# Patient Record
Sex: Male | Born: 1940
Health system: Southern US, Community
[De-identification: ages and names within clinical notes are randomized; demographics above are authoritative.]

## PROBLEM LIST (undated history)

## (undated) DIAGNOSIS — I10 Essential (primary) hypertension: Secondary | ICD-10-CM

## (undated) DIAGNOSIS — R109 Unspecified abdominal pain: Secondary | ICD-10-CM

## (undated) DIAGNOSIS — M1990 Unspecified osteoarthritis, unspecified site: Secondary | ICD-10-CM

## (undated) DIAGNOSIS — I4891 Unspecified atrial fibrillation: Secondary | ICD-10-CM

## (undated) DIAGNOSIS — E119 Type 2 diabetes mellitus without complications: Secondary | ICD-10-CM

## (undated) HISTORY — DX: Unspecified osteoarthritis, unspecified site: M19.90

---

## 2013-10-14 ENCOUNTER — Ambulatory Visit (INDEPENDENT_AMBULATORY_CARE_PROVIDER_SITE_OTHER): Payer: Medicare HMO

## 2013-10-14 ENCOUNTER — Ambulatory Visit (INDEPENDENT_AMBULATORY_CARE_PROVIDER_SITE_OTHER): Payer: Medicare HMO | Admitting: Family Medicine

## 2013-10-14 VITALS — BP 142/78 | HR 81 | Temp 97.7°F | Resp 16 | Ht 69.0 in | Wt 216.2 lb

## 2013-10-14 DIAGNOSIS — M25579 Pain in unspecified ankle and joints of unspecified foot: Secondary | ICD-10-CM

## 2013-10-14 DIAGNOSIS — M109 Gout, unspecified: Secondary | ICD-10-CM

## 2013-10-14 DIAGNOSIS — B351 Tinea unguium: Secondary | ICD-10-CM

## 2013-10-14 DIAGNOSIS — M25571 Pain in right ankle and joints of right foot: Secondary | ICD-10-CM

## 2013-10-14 DIAGNOSIS — M1990 Unspecified osteoarthritis, unspecified site: Secondary | ICD-10-CM

## 2013-10-14 DIAGNOSIS — E119 Type 2 diabetes mellitus without complications: Secondary | ICD-10-CM

## 2013-10-14 DIAGNOSIS — M129 Arthropathy, unspecified: Secondary | ICD-10-CM

## 2013-10-14 DIAGNOSIS — M10071 Idiopathic gout, right ankle and foot: Secondary | ICD-10-CM

## 2013-10-14 LAB — POCT CBC
GRANULOCYTE PERCENT: 81.1 % — AB (ref 37–80)
HCT, POC: 45.7 % (ref 43.5–53.7)
Hemoglobin: 14.5 g/dL (ref 14.1–18.1)
Lymph, poc: 1.1 (ref 0.6–3.4)
MCH, POC: 30.2 pg (ref 27–31.2)
MCHC: 31.8 g/dL (ref 31.8–35.4)
MCV: 95.1 fL (ref 80–97)
MID (CBC): 0.3 (ref 0–0.9)
MPV: 7 fL (ref 0–99.8)
PLATELET COUNT, POC: 244 10*3/uL (ref 142–424)
POC Granulocyte: 0.3 — AB (ref 2–6.9)
POC LYMPH PERCENT: 15.3 %L (ref 10–50)
POC MID %: 3.6 % (ref 0–12)
RBC: 4.81 M/uL (ref 4.69–6.13)
RDW, POC: 14.2 %
WBC: 7 10*3/uL (ref 4.6–10.2)

## 2013-10-14 LAB — POCT SEDIMENTATION RATE: POCT SED RATE: 16 mm/h (ref 0–22)

## 2013-10-14 LAB — POCT GLYCOSYLATED HEMOGLOBIN (HGB A1C): Hemoglobin A1C: 6.5

## 2013-10-14 LAB — URIC ACID: Uric Acid, Serum: 5 mg/dL (ref 4.0–7.8)

## 2013-10-14 LAB — GLUCOSE, POCT (MANUAL RESULT ENTRY): POC Glucose: 186 mg/dl — AB (ref 70–99)

## 2013-10-14 MED ORDER — COLCHICINE 0.6 MG PO TABS: ORAL_TABLET | ORAL | Status: DC

## 2013-10-14 MED ORDER — INDOMETHACIN 50 MG PO CAPS: 50.0000 mg | ORAL_CAPSULE | Freq: Three times a day (TID) | ORAL | Status: DC

## 2013-10-14 MED ORDER — TRAMADOL HCL 50 MG PO TABS: 50.0000 mg | ORAL_TABLET | Freq: Three times a day (TID) | ORAL | Status: DC | PRN

## 2013-10-14 NOTE — Patient Instructions (Addendum)
Start the indomethacin three times a day with food. Ice your foot for 20 minutes four times a day.  If the colcyrs is not to expensive, take these today but if they are expensive, it is ok to not take them.  If you are having any additional pain, you can try the tramadol in addition.  Whenever you can elevate your foot and keep it propped up.  If you are still having any pain in 1-2 weeks or if it worsens, come back to clinic immediately.  Gout Gout is an inflammatory arthritis caused by a buildup of uric acid crystals in the joints. Uric acid is a chemical that is normally present in the blood. When the level of uric acid in the blood is too high it can form crystals that deposit in your joints and tissues. This causes joint redness, soreness, and swelling (inflammation). Repeat attacks are common. Over time, uric acid crystals can form into masses (tophi) near a joint, destroying bone and causing disfigurement. Gout is treatable and often preventable. CAUSES  The disease begins with elevated levels of uric acid in the blood. Uric acid is produced by your body when it breaks down a naturally found substance called purines. Certain foods you eat, such as meats and fish, contain high amounts of purines. Causes of an elevated uric acid level include:  Being passed down from parent to child (heredity).  Diseases that cause increased uric acid production (such as obesity, psoriasis, and certain cancers).  Excessive alcohol use.  Diet, especially diets rich in meat and seafood.  Medicines, including certain cancer-fighting medicines (chemotherapy), water pills (diuretics), and aspirin.  Chronic kidney disease. The kidneys are no longer able to remove uric acid well.  Problems with metabolism. Conditions strongly associated with gout include:  Obesity.  High blood pressure.  High cholesterol.  Diabetes. Not everyone with elevated uric acid levels gets gout. It is not understood why some people  get gout and others do not. Surgery, joint injury, and eating too much of certain foods are some of the factors that can lead to gout attacks. SYMPTOMS   An attack of gout comes on quickly. It causes intense pain with redness, swelling, and warmth in a joint.  Fever can occur.  Often, only one joint is involved. Certain joints are more commonly involved:  Base of the big toe.  Knee.  Ankle.  Wrist.  Finger. Without treatment, an attack usually goes away in a few days to weeks. Between attacks, you usually will not have symptoms, which is different from many other forms of arthritis. DIAGNOSIS  Your caregiver will suspect gout based on your symptoms and exam. In some cases, tests may be recommended. The tests may include:  Blood tests.  Urine tests.  X-rays.  Joint fluid exam. This exam requires a needle to remove fluid from the joint (arthrocentesis). Using a microscope, gout is confirmed when uric acid crystals are seen in the joint fluid. TREATMENT  There are two phases to gout treatment: treating the sudden onset (acute) attack and preventing attacks (prophylaxis).  Treatment of an Acute Attack.  Medicines are used. These include anti-inflammatory medicines or steroid medicines.  An injection of steroid medicine into the affected joint is sometimes necessary.  The painful joint is rested. Movement can worsen the arthritis.  You may use warm or cold treatments on painful joints, depending which works best for you.  Treatment to Prevent Attacks.  If you suffer from frequent gout attacks, your caregiver may  advise preventive medicine. These medicines are started after the acute attack subsides. These medicines either help your kidneys eliminate uric acid from your body or decrease your uric acid production. You may need to stay on these medicines for a very long time.  The early phase of treatment with preventive medicine can be associated with an increase in acute gout  attacks. For this reason, during the first few months of treatment, your caregiver may also advise you to take medicines usually used for acute gout treatment. Be sure you understand your caregiver's directions. Your caregiver may make several adjustments to your medicine dose before these medicines are effective.  Discuss dietary treatment with your caregiver or dietitian. Alcohol and drinks high in sugar and fructose and foods such as meat, poultry, and seafood can increase uric acid levels. Your caregiver or dietitian can advise you on drinks and foods that should be limited. HOME CARE INSTRUCTIONS   Do not take aspirin to relieve pain. This raises uric acid levels.  Only take over-the-counter or prescription medicines for pain, discomfort, or fever as directed by your caregiver.  Rest the joint as much as possible. When in bed, keep sheets and blankets off painful areas.  Keep the affected joint raised (elevated).  Apply warm or cold treatments to painful joints. Use of warm or cold treatments depends on which works best for you.  Use crutches if the painful joint is in your leg.  Drink enough fluids to keep your urine clear or pale yellow. This helps your body get rid of uric acid. Limit alcohol, sugary drinks, and fructose drinks.  Follow your dietary instructions. Pay careful attention to the amount of protein you eat. Your daily diet should emphasize fruits, vegetables, whole grains, and fat-free or low-fat milk products. Discuss the use of coffee, vitamin C, and cherries with your caregiver or dietitian. These may be helpful in lowering uric acid levels.  Maintain a healthy body weight. SEEK MEDICAL CARE IF:   You develop diarrhea, vomiting, or any side effects from medicines.  You do not feel better in 24 hours, or you are getting worse. SEEK IMMEDIATE MEDICAL CARE IF:   Your joint becomes suddenly more tender, and you have chills or a fever. MAKE SURE YOU:   Understand  these instructions.  Will watch your condition.  Will get help right away if you are not doing well or get worse. Document Released: 01/07/2000 Document Revised: 05/26/2013 Document Reviewed: 08/23/2011 [facility] Patient Information 2015 [place], [place]. This information is not intended to replace advice given to you by your health care provider. Make sure you discuss any questions you have with your health care provider.

## 2013-10-14 NOTE — Progress Notes (Signed)
Subjective:    Patient ID: Harold Pennington, male    DOB: September 03, 1940, 73 y.o.   MRN: [id number] Chief Complaint  Patient presents with  . Foot Pain    pt states he has been having right foot pain for awhile now;  pt states pain sometimes keeps him from getting a good night sleep;  pt states the pain often radiates up his leg and it hurts to drive    HPI  Has noticed increasing pain in Rt foot and stiffness in ankle for the past 2-3 wks.  No prior injury.  Has worsened recently significantly. No known injury.  Last night he rolled over in bed and had excruciating pain which woke him from sleep.  He has had pain with wearing his shoes.  Tried advil last night and this morning w/ any relief. No h/o gout. Sxs actually improve with walking but does limp a little.  Past Medical History  Diagnosis Date  . Arthritis    No current outpatient prescriptions on file prior to visit.   No current facility-administered medications on file prior to visit.   Allergies  Allergen Reactions  . Hydrocodone     Pt states he just feels horrible      Review of Systems  Constitutional: Negative for fever, chills, diaphoresis, activity change and appetite change.  Musculoskeletal: Positive for arthralgias, back pain, gait problem, joint swelling, myalgias, neck pain and neck stiffness.  Skin: Negative for color change, rash and wound.  Neurological: Negative for weakness and numbness.  Hematological: Negative for adenopathy. Does not bruise/bleed easily.  Psychiatric/Behavioral: Positive for sleep disturbance.       Objective:  BP 142/78  Pulse 81  Temp(Src) 97.7 F (36.5 C) (Oral)  Resp 16  Ht  (1.753 m)  Wt 216 lb 3.2 oz (98.068 kg)  BMI 31.91 kg/m2  SpO2 97%  Physical Exam  Constitutional: He is oriented to person, place, and time. He appears well-developed and well-nourished. No distress.  HENT:  Head: Normocephalic and atraumatic.  Eyes: No scleral icterus.  Cardiovascular:   Pulses:      Dorsalis pedis pulses are 2+ on the right side.       Posterior tibial pulses are 2+ on the right side.  Pulmonary/Chest: Effort normal.  Musculoskeletal:       Right ankle: Normal. He exhibits normal range of motion, no swelling, no laceration and normal pulse. No tenderness.       Right foot: He exhibits tenderness, bony tenderness and swelling. He exhibits normal range of motion and normal capillary refill.       Feet:  Neurological: He is alert and oriented to person, place, and time. He has normal strength. He displays no atrophy. No sensory deficit. He exhibits normal muscle tone. Gait normal.  Skin: Skin is warm and dry. He is not diaphoretic.  Psychiatric: He has a normal mood and affect. His behavior is normal.      Results for orders placed in visit on 10/14/13  POCT CBC      Result Value Ref Range   WBC 7.0  4.6 - 10.2 K/uL   Lymph, poc 1.1  0.6 - 3.4   POC LYMPH PERCENT 15.3  10 - 50 %L   MID (cbc) 0.3  0 - 0.9   POC MID % 3.6  0 - 12 %M   POC Granulocyte 0.3 (*) 2 - 6.9   Granulocyte percent 81.1 (*) 37 - 80 %G   RBC 4.81  4.69 - 6.13 M/uL   Hemoglobin 14.5  14.1 - 18.1 g/dL   HCT, POC 40.9  81.1 - 53.7 %   MCV 95.1  80 - 97 fL   MCH, POC 30.2  27 - 31.2 pg   MCHC 31.8  31.8 - 35.4 g/dL   RDW, POC 91.4     Platelet Count, POC 244  142 - 424 K/uL   MPV 7.0  0 - 99.8 fL  GLUCOSE, POCT (MANUAL RESULT ENTRY)      Result Value Ref Range   POC Glucose 186 (*) 70 - 99 mg/dl  POCT GLYCOSYLATED HEMOGLOBIN (HGB A1C)      Result Value Ref Range   Hemoglobin A1C 6.5        [facility] reading (PRIMARY) by  Dr. Clelia Croft. Rt foot xray: mild DJD but no acute abnormality Assessment & Plan:   Pain in joint, ankle and foot, right - Plan: POCT CBC, POCT glucose (manual entry), POCT SEDIMENTATION RATE, Uric Acid, DG Foot Complete Right, POCT glycosylated hemoglobin (Hb A1C)  Arthritis - Suspect flair of OA but cannot exclude either new onset of gout though course  atypical and exam findings minimal or occult stress fracture/strain. Pt cannot take off any time from work but rec starting indocin tidcc. Offered post-op shoe and oow note but pt declines - can't afford. Rec RICE, RTC if sxs persist or worsen. Can try colchicine today but may be cost-prohibitive. If sxs persist or worsen, RTC for further eval.  Acute idiopathic gout of right foot   Severe tinea pedis and onychomycosis - declines treatment - encouraged pt to f/u w/ PCP for lamisil.  DM - well controlled but will void prednisone  Meds ordered this encounter  Medications  . metformin (FORTAMET) 1000 MG (OSM) 24 hr tablet    Sig: Take 1,000 mg by mouth daily with breakfast.  . losartan (COZAAR) 50 MG tablet    Sig: Take 50 mg by mouth 2 (two) times daily.  Marland Kitchen DISCONTD: naproxen (NAPROSYN) 500 MG tablet    Sig: Take 500 mg by mouth once.  . indomethacin (INDOCIN) 50 MG capsule    Sig: Take 1 capsule (50 mg total) by mouth 3 (three) times daily with meals.    Dispense:  60 capsule    Refill:  1  . colchicine 0.6 MG tablet    Sig: Take 2 tabs po x 1 today and 1 tab an hour later    Dispense:  3 tablet    Refill:  0  . traMADol (ULTRAM) 50 MG tablet    Sig: Take 1 tablet (50 mg total) by mouth every 8 (eight) hours as needed.    Dispense:  30 tablet    Refill:  0    Norberto Sorenson, MD MPH

## 2014-05-14 DIAGNOSIS — E119 Type 2 diabetes mellitus without complications: Secondary | ICD-10-CM | POA: Diagnosis not present

## 2014-05-14 DIAGNOSIS — I10 Essential (primary) hypertension: Secondary | ICD-10-CM | POA: Diagnosis not present

## 2014-05-14 DIAGNOSIS — Z125 Encounter for screening for malignant neoplasm of prostate: Secondary | ICD-10-CM | POA: Diagnosis not present

## 2014-05-21 DIAGNOSIS — I10 Essential (primary) hypertension: Secondary | ICD-10-CM | POA: Diagnosis not present

## 2014-05-21 DIAGNOSIS — E118 Type 2 diabetes mellitus with unspecified complications: Secondary | ICD-10-CM | POA: Diagnosis not present

## 2014-05-21 DIAGNOSIS — E789 Disorder of lipoprotein metabolism, unspecified: Secondary | ICD-10-CM | POA: Diagnosis not present

## 2014-05-21 DIAGNOSIS — M1990 Unspecified osteoarthritis, unspecified site: Secondary | ICD-10-CM | POA: Diagnosis not present

## 2014-07-13 DIAGNOSIS — H524 Presbyopia: Secondary | ICD-10-CM | POA: Diagnosis not present

## 2014-07-13 DIAGNOSIS — E119 Type 2 diabetes mellitus without complications: Secondary | ICD-10-CM | POA: Diagnosis not present

## 2014-07-13 DIAGNOSIS — H01009 Unspecified blepharitis unspecified eye, unspecified eyelid: Secondary | ICD-10-CM | POA: Diagnosis not present

## 2014-07-13 DIAGNOSIS — H25013 Cortical age-related cataract, bilateral: Secondary | ICD-10-CM | POA: Diagnosis not present

## 2014-09-10 DIAGNOSIS — M25569 Pain in unspecified knee: Secondary | ICD-10-CM | POA: Diagnosis not present

## 2014-09-10 DIAGNOSIS — M1711 Unilateral primary osteoarthritis, right knee: Secondary | ICD-10-CM | POA: Diagnosis not present

## 2014-11-12 DIAGNOSIS — Z125 Encounter for screening for malignant neoplasm of prostate: Secondary | ICD-10-CM | POA: Diagnosis not present

## 2014-11-12 DIAGNOSIS — I10 Essential (primary) hypertension: Secondary | ICD-10-CM | POA: Diagnosis not present

## 2014-11-12 DIAGNOSIS — E119 Type 2 diabetes mellitus without complications: Secondary | ICD-10-CM | POA: Diagnosis not present

## 2014-11-12 DIAGNOSIS — Z793 Long term (current) use of hormonal contraceptives: Secondary | ICD-10-CM | POA: Diagnosis not present

## 2014-11-19 DIAGNOSIS — Z23 Encounter for immunization: Secondary | ICD-10-CM | POA: Diagnosis not present

## 2014-11-19 DIAGNOSIS — I10 Essential (primary) hypertension: Secondary | ICD-10-CM | POA: Diagnosis not present

## 2014-11-19 DIAGNOSIS — E118 Type 2 diabetes mellitus with unspecified complications: Secondary | ICD-10-CM | POA: Diagnosis not present

## 2015-02-09 DIAGNOSIS — I10 Essential (primary) hypertension: Secondary | ICD-10-CM | POA: Diagnosis not present

## 2015-05-20 DIAGNOSIS — I10 Essential (primary) hypertension: Secondary | ICD-10-CM | POA: Diagnosis not present

## 2015-05-20 DIAGNOSIS — Z0000 Encounter for general adult medical examination without abnormal findings: Secondary | ICD-10-CM | POA: Diagnosis not present

## 2015-05-20 DIAGNOSIS — E119 Type 2 diabetes mellitus without complications: Secondary | ICD-10-CM | POA: Diagnosis not present

## 2015-05-20 DIAGNOSIS — Z125 Encounter for screening for malignant neoplasm of prostate: Secondary | ICD-10-CM | POA: Diagnosis not present

## 2015-06-03 DIAGNOSIS — E118 Type 2 diabetes mellitus with unspecified complications: Secondary | ICD-10-CM | POA: Diagnosis not present

## 2015-06-03 DIAGNOSIS — I10 Essential (primary) hypertension: Secondary | ICD-10-CM | POA: Diagnosis not present

## 2015-06-03 DIAGNOSIS — M25569 Pain in unspecified knee: Secondary | ICD-10-CM | POA: Diagnosis not present

## 2015-12-29 DIAGNOSIS — E119 Type 2 diabetes mellitus without complications: Secondary | ICD-10-CM | POA: Diagnosis not present

## 2015-12-29 DIAGNOSIS — I10 Essential (primary) hypertension: Secondary | ICD-10-CM | POA: Diagnosis not present

## 2016-01-05 DIAGNOSIS — I10 Essential (primary) hypertension: Secondary | ICD-10-CM | POA: Diagnosis not present

## 2016-01-05 DIAGNOSIS — Z23 Encounter for immunization: Secondary | ICD-10-CM | POA: Diagnosis not present

## 2016-01-05 DIAGNOSIS — E118 Type 2 diabetes mellitus with unspecified complications: Secondary | ICD-10-CM | POA: Diagnosis not present

## 2016-01-19 DIAGNOSIS — I10 Essential (primary) hypertension: Secondary | ICD-10-CM | POA: Diagnosis not present

## 2016-03-14 DIAGNOSIS — H01022 Squamous blepharitis right lower eyelid: Secondary | ICD-10-CM | POA: Diagnosis not present

## 2016-03-14 DIAGNOSIS — H01025 Squamous blepharitis left lower eyelid: Secondary | ICD-10-CM | POA: Diagnosis not present

## 2016-03-14 DIAGNOSIS — H2513 Age-related nuclear cataract, bilateral: Secondary | ICD-10-CM | POA: Diagnosis not present

## 2016-03-14 DIAGNOSIS — H524 Presbyopia: Secondary | ICD-10-CM | POA: Diagnosis not present

## 2016-03-14 DIAGNOSIS — E119 Type 2 diabetes mellitus without complications: Secondary | ICD-10-CM | POA: Diagnosis not present

## 2016-03-14 DIAGNOSIS — H25013 Cortical age-related cataract, bilateral: Secondary | ICD-10-CM | POA: Diagnosis not present

## 2016-05-31 DIAGNOSIS — E119 Type 2 diabetes mellitus without complications: Secondary | ICD-10-CM | POA: Diagnosis not present

## 2016-05-31 DIAGNOSIS — I10 Essential (primary) hypertension: Secondary | ICD-10-CM | POA: Diagnosis not present

## 2016-05-31 DIAGNOSIS — Z125 Encounter for screening for malignant neoplasm of prostate: Secondary | ICD-10-CM | POA: Diagnosis not present

## 2016-05-31 DIAGNOSIS — Z0000 Encounter for general adult medical examination without abnormal findings: Secondary | ICD-10-CM | POA: Diagnosis not present

## 2016-06-07 DIAGNOSIS — R9431 Abnormal electrocardiogram [ECG] [EKG]: Secondary | ICD-10-CM | POA: Diagnosis not present

## 2016-06-07 DIAGNOSIS — E118 Type 2 diabetes mellitus with unspecified complications: Secondary | ICD-10-CM | POA: Diagnosis not present

## 2016-06-07 DIAGNOSIS — L0390 Cellulitis, unspecified: Secondary | ICD-10-CM | POA: Diagnosis not present

## 2016-06-07 DIAGNOSIS — I10 Essential (primary) hypertension: Secondary | ICD-10-CM | POA: Diagnosis not present

## 2016-06-20 DIAGNOSIS — Z1212 Encounter for screening for malignant neoplasm of rectum: Secondary | ICD-10-CM | POA: Diagnosis not present

## 2016-06-20 DIAGNOSIS — I481 Persistent atrial fibrillation: Secondary | ICD-10-CM | POA: Diagnosis not present

## 2016-06-20 DIAGNOSIS — Z1211 Encounter for screening for malignant neoplasm of colon: Secondary | ICD-10-CM | POA: Diagnosis not present

## 2016-06-20 DIAGNOSIS — I10 Essential (primary) hypertension: Secondary | ICD-10-CM | POA: Diagnosis not present

## 2016-06-28 DIAGNOSIS — I481 Persistent atrial fibrillation: Secondary | ICD-10-CM | POA: Diagnosis not present

## 2016-06-28 DIAGNOSIS — I10 Essential (primary) hypertension: Secondary | ICD-10-CM | POA: Diagnosis not present

## 2016-07-14 DIAGNOSIS — E119 Type 2 diabetes mellitus without complications: Secondary | ICD-10-CM | POA: Diagnosis not present

## 2016-07-14 DIAGNOSIS — I10 Essential (primary) hypertension: Secondary | ICD-10-CM | POA: Diagnosis not present

## 2016-07-14 DIAGNOSIS — I4891 Unspecified atrial fibrillation: Secondary | ICD-10-CM | POA: Diagnosis not present

## 2016-07-31 DIAGNOSIS — I481 Persistent atrial fibrillation: Secondary | ICD-10-CM | POA: Diagnosis not present

## 2016-07-31 DIAGNOSIS — I10 Essential (primary) hypertension: Secondary | ICD-10-CM | POA: Diagnosis not present

## 2016-08-17 DIAGNOSIS — I10 Essential (primary) hypertension: Secondary | ICD-10-CM | POA: Insufficient documentation

## 2016-08-17 DIAGNOSIS — I480 Paroxysmal atrial fibrillation: Secondary | ICD-10-CM | POA: Diagnosis not present

## 2016-08-17 DIAGNOSIS — I481 Persistent atrial fibrillation: Secondary | ICD-10-CM | POA: Diagnosis not present

## 2016-08-17 DIAGNOSIS — E782 Mixed hyperlipidemia: Secondary | ICD-10-CM | POA: Diagnosis not present

## 2016-09-04 DIAGNOSIS — I481 Persistent atrial fibrillation: Secondary | ICD-10-CM | POA: Diagnosis not present

## 2016-09-20 ENCOUNTER — Ambulatory Visit
Admission: RE | Admit: 2016-09-20 | Discharge: 2016-09-20 | Disposition: A | Discharge status: Home / Self Care | Payer: Medicare Other | Source: Ambulatory Visit | Attending: Internal Medicine | Admitting: Internal Medicine

## 2016-09-20 ENCOUNTER — Encounter: Payer: Self-pay | Admitting: Anesthesiology

## 2016-09-20 ENCOUNTER — Ambulatory Visit: Payer: Medicare Other | Admitting: Anesthesiology

## 2016-09-20 ENCOUNTER — Encounter
Admission: RE | Disposition: A | Discharge status: Home / Self Care | Payer: Self-pay | Source: Ambulatory Visit | Attending: Internal Medicine

## 2016-09-20 DIAGNOSIS — Z7901 Long term (current) use of anticoagulants: Secondary | ICD-10-CM | POA: Diagnosis not present

## 2016-09-20 DIAGNOSIS — I480 Paroxysmal atrial fibrillation: Secondary | ICD-10-CM | POA: Diagnosis not present

## 2016-09-20 DIAGNOSIS — Z79899 Other long term (current) drug therapy: Secondary | ICD-10-CM | POA: Diagnosis not present

## 2016-09-20 DIAGNOSIS — I4891 Unspecified atrial fibrillation: Secondary | ICD-10-CM | POA: Insufficient documentation

## 2016-09-20 DIAGNOSIS — I4892 Unspecified atrial flutter: Secondary | ICD-10-CM | POA: Insufficient documentation

## 2016-09-20 DIAGNOSIS — Z791 Long term (current) use of non-steroidal anti-inflammatories (NSAID): Secondary | ICD-10-CM | POA: Diagnosis not present

## 2016-09-20 DIAGNOSIS — M1990 Unspecified osteoarthritis, unspecified site: Secondary | ICD-10-CM | POA: Diagnosis not present

## 2016-09-20 HISTORY — DX: Unspecified atrial fibrillation: I48.91

## 2016-09-20 HISTORY — PX: CARDIOVERSION: EP1203

## 2016-09-20 LAB — BASIC METABOLIC PANEL
Anion gap: 10 (ref 5–15)
BUN: 24 mg/dL — AB (ref 6–20)
CHLORIDE: 104 mmol/L (ref 101–111)
CO2: 24 mmol/L (ref 22–32)
CREATININE: 1.15 mg/dL (ref 0.61–1.24)
Calcium: 9.5 mg/dL (ref 8.9–10.3)
GFR calc Af Amer: >60 mL/min (ref >60)
GFR calc non Af Amer: >60 mL/min (ref >60)
Glucose, Bld: 109 mg/dL — ABNORMAL HIGH (ref 65–99)
Potassium: 4 mmol/L (ref 3.5–5.1)
SODIUM: 138 mmol/L (ref 135–145)

## 2016-09-20 SURGERY — CARDIOVERSION (CATH LAB)
Anesthesia: General

## 2016-09-20 MED ORDER — AMIODARONE HCL 200 MG PO TABS: 200.0000 mg | ORAL_TABLET | Freq: Every day | ORAL | 2 refills | Status: DC

## 2016-09-20 MED ORDER — SODIUM CHLORIDE 0.9 % IV SOLN
INTRAVENOUS | Status: DC
  Administered 2016-09-20: 08:00:00 via INTRAVENOUS

## 2016-09-20 MED ORDER — PROPOFOL 10 MG/ML IV BOLUS
INTRAVENOUS | Status: DC | PRN
  Administered 2016-09-20: 60 mg via INTRAVENOUS

## 2016-09-20 MED ORDER — PROPOFOL 10 MG/ML IV BOLUS
INTRAVENOUS | Status: AC
  Filled 2016-09-20: qty 20

## 2016-09-20 NOTE — CV Procedure (Signed)
Electrical Cardioversion Procedure Note Harold Pennington [id number] 11/22/1940  Procedure: Electrical Cardioversion Indications:  Atrial Fibrillation  Procedure Details Consent: Risks of procedure as well as the alternatives and risks of each were explained to the (patient/caregiver).  Consent for procedure obtained. Time Out: Verified patient identification, verified procedure, site/side was marked, verified correct patient position, special equipment/implants available, medications/allergies/relevent history reviewed, required imaging and test results available.  Performed  Patient placed on cardiac monitor, pulse oximetry, supplemental oxygen as necessary.  Sedation given: Benzodiazepines and Short-acting barbiturates Pacer pads placed anterior and posterior chest.  Cardioverted 1 time(s).  Cardioverted at 120J.  Evaluation Findings: Post procedure EKG shows: NSR Complications: None Patient did tolerate procedure well.   Harold Pennington 09/20/2016, 8:26 AM

## 2016-09-20 NOTE — Anesthesia Post-op Follow-up Note (Signed)
Anesthesia QCDR form completed.        

## 2016-09-20 NOTE — Transfer of Care (Signed)
Immediate Anesthesia Transfer of Care Note  Patient: Harold Pennington  Procedure(s) Performed: Procedure(s): CARDIOVERSION (N/A)  Patient Location: PACU  Anesthesia Type:General  Level of Consciousness: sedated  Airway & Oxygen Therapy: Patient Spontanous Breathing and Patient connected to nasal cannula oxygen  Post-op Assessment: Report given to RN and Post -op Vital signs reviewed and stable  Post vital signs: Reviewed and stable  Last Vitals:  Vitals:   09/20/16 0701  BP: (!) 138/92  Pulse: 70  Resp: 16  Temp: 36.9 C  SpO2: 98%    Last Pain:  Vitals:   09/20/16 0701  TempSrc: Oral         Complications: No apparent anesthesia complications

## 2016-09-20 NOTE — Anesthesia Procedure Notes (Signed)
Date/Time: 09/20/2016 7:17 AM Performed by: Junious Silk Pre-anesthesia Checklist: Patient identified, Emergency Drugs available, Suction available, Patient being monitored and Timeout performed Oxygen Delivery Method: Nasal cannula

## 2016-09-20 NOTE — Anesthesia Postprocedure Evaluation (Signed)
Anesthesia Post Note  Patient: Harold Pennington  Procedure(s) Performed: Procedure(s) (LRB): CARDIOVERSION (N/A)  Patient location during evaluation: Cath Lab Anesthesia Type: General Level of consciousness: awake and alert Pain management: pain level controlled Vital Signs Assessment: post-procedure vital signs reviewed and stable Respiratory status: spontaneous breathing, nonlabored ventilation, respiratory function stable and patient connected to nasal cannula oxygen Cardiovascular status: blood pressure returned to baseline and stable Postop Assessment: no signs of nausea or vomiting Anesthetic complications: no     Last Vitals:  Vitals:   09/20/16 0745 09/20/16 0754  BP: 112/66 106/66  Pulse:    Resp:    Temp:    SpO2: 100%     Last Pain:  Vitals:   09/20/16 0701  TempSrc: Oral                 Shalimar Mcclain S

## 2016-09-20 NOTE — Anesthesia Preprocedure Evaluation (Addendum)
Anesthesia Evaluation  Patient identified by MRN, date of birth, ID band Patient awake    Reviewed: Allergy & Precautions, NPO status , Patient's Chart, lab work & pertinent test results, reviewed documented beta blocker date and time   Airway Mallampati: II  TM Distance: >3 FB     Dental  (+) Chipped, Dental Advisory Given, Poor Dentition   Pulmonary           Cardiovascular      Neuro/Psych    GI/Hepatic   Endo/Other    Renal/GU      Musculoskeletal  (+) Arthritis ,   Abdominal   Peds  Hematology   Anesthesia Other Findings Broken teeth.  Reproductive/Obstetrics                            Anesthesia Physical Anesthesia Plan  ASA: III  Anesthesia Plan: General   Post-op Pain Management:    Induction: Intravenous  PONV Risk Score and Plan:   Airway Management Planned:   Additional Equipment:   Intra-op Plan:   Post-operative Plan:   Informed Consent: I have reviewed the patients History and Physical, chart, labs and discussed the procedure including the risks, benefits and alternatives for the proposed anesthesia with the patient or authorized representative who has indicated his/her understanding and acceptance.     Plan Discussed with: CRNA  Anesthesia Plan Comments:         Anesthesia Quick Evaluation

## 2016-10-02 DIAGNOSIS — E782 Mixed hyperlipidemia: Secondary | ICD-10-CM | POA: Diagnosis not present

## 2016-10-02 DIAGNOSIS — R001 Bradycardia, unspecified: Secondary | ICD-10-CM | POA: Diagnosis not present

## 2016-10-02 DIAGNOSIS — I481 Persistent atrial fibrillation: Secondary | ICD-10-CM | POA: Diagnosis not present

## 2016-10-02 DIAGNOSIS — I10 Essential (primary) hypertension: Secondary | ICD-10-CM | POA: Diagnosis not present

## 2016-10-30 DIAGNOSIS — I480 Paroxysmal atrial fibrillation: Secondary | ICD-10-CM | POA: Diagnosis not present

## 2016-10-30 DIAGNOSIS — I10 Essential (primary) hypertension: Secondary | ICD-10-CM | POA: Diagnosis not present

## 2016-10-30 DIAGNOSIS — I481 Persistent atrial fibrillation: Secondary | ICD-10-CM | POA: Diagnosis not present

## 2016-10-30 DIAGNOSIS — E782 Mixed hyperlipidemia: Secondary | ICD-10-CM | POA: Diagnosis not present

## 2016-10-30 DIAGNOSIS — R001 Bradycardia, unspecified: Secondary | ICD-10-CM | POA: Diagnosis not present

## 2016-11-27 DIAGNOSIS — I10 Essential (primary) hypertension: Secondary | ICD-10-CM | POA: Diagnosis not present

## 2016-11-27 DIAGNOSIS — I480 Paroxysmal atrial fibrillation: Secondary | ICD-10-CM | POA: Diagnosis not present

## 2016-11-27 DIAGNOSIS — E782 Mixed hyperlipidemia: Secondary | ICD-10-CM | POA: Diagnosis not present

## 2016-11-30 DIAGNOSIS — E119 Type 2 diabetes mellitus without complications: Secondary | ICD-10-CM | POA: Diagnosis not present

## 2016-12-07 DIAGNOSIS — M150 Primary generalized (osteo)arthritis: Secondary | ICD-10-CM | POA: Diagnosis not present

## 2016-12-07 DIAGNOSIS — Z23 Encounter for immunization: Secondary | ICD-10-CM | POA: Diagnosis not present

## 2016-12-07 DIAGNOSIS — I10 Essential (primary) hypertension: Secondary | ICD-10-CM | POA: Diagnosis not present

## 2016-12-07 DIAGNOSIS — I482 Chronic atrial fibrillation: Secondary | ICD-10-CM | POA: Diagnosis not present

## 2016-12-07 DIAGNOSIS — E782 Mixed hyperlipidemia: Secondary | ICD-10-CM | POA: Diagnosis not present

## 2017-01-02 ENCOUNTER — Other Ambulatory Visit: Payer: Self-pay | Admitting: Family Medicine

## 2017-01-02 DIAGNOSIS — R1031 Right lower quadrant pain: Secondary | ICD-10-CM

## 2017-01-03 ENCOUNTER — Ambulatory Visit
Admission: RE | Admit: 2017-01-03 | Discharge: 2017-01-03 | Disposition: A | Discharge status: Home / Self Care | Payer: Medicare Other | Source: Ambulatory Visit | Attending: Family Medicine | Admitting: Family Medicine

## 2017-01-03 DIAGNOSIS — R1031 Right lower quadrant pain: Secondary | ICD-10-CM | POA: Insufficient documentation

## 2017-01-03 DIAGNOSIS — R1030 Lower abdominal pain, unspecified: Secondary | ICD-10-CM | POA: Diagnosis not present

## 2017-01-09 DIAGNOSIS — H938X1 Other specified disorders of right ear: Secondary | ICD-10-CM | POA: Diagnosis not present

## 2017-01-09 DIAGNOSIS — H6121 Impacted cerumen, right ear: Secondary | ICD-10-CM | POA: Diagnosis not present

## 2017-02-08 DIAGNOSIS — R1031 Right lower quadrant pain: Secondary | ICD-10-CM | POA: Diagnosis not present

## 2017-02-08 DIAGNOSIS — K4090 Unilateral inguinal hernia, without obstruction or gangrene, not specified as recurrent: Secondary | ICD-10-CM | POA: Diagnosis not present

## 2017-02-08 DIAGNOSIS — R1909 Other intra-abdominal and pelvic swelling, mass and lump: Secondary | ICD-10-CM | POA: Diagnosis not present

## 2017-02-20 DIAGNOSIS — I10 Essential (primary) hypertension: Secondary | ICD-10-CM | POA: Diagnosis not present

## 2017-02-20 DIAGNOSIS — I480 Paroxysmal atrial fibrillation: Secondary | ICD-10-CM | POA: Diagnosis not present

## 2017-02-20 DIAGNOSIS — K4090 Unilateral inguinal hernia, without obstruction or gangrene, not specified as recurrent: Secondary | ICD-10-CM | POA: Diagnosis not present

## 2017-02-20 DIAGNOSIS — Z7901 Long term (current) use of anticoagulants: Secondary | ICD-10-CM | POA: Diagnosis not present

## 2017-03-01 DIAGNOSIS — R1030 Lower abdominal pain, unspecified: Secondary | ICD-10-CM | POA: Diagnosis not present

## 2017-03-08 DIAGNOSIS — I482 Chronic atrial fibrillation: Secondary | ICD-10-CM | POA: Diagnosis not present

## 2017-03-08 DIAGNOSIS — M150 Primary generalized (osteo)arthritis: Secondary | ICD-10-CM | POA: Diagnosis not present

## 2017-03-08 DIAGNOSIS — E782 Mixed hyperlipidemia: Secondary | ICD-10-CM | POA: Diagnosis not present

## 2017-03-08 DIAGNOSIS — E119 Type 2 diabetes mellitus without complications: Secondary | ICD-10-CM | POA: Diagnosis not present

## 2017-03-15 DIAGNOSIS — E782 Mixed hyperlipidemia: Secondary | ICD-10-CM | POA: Diagnosis not present

## 2017-03-15 DIAGNOSIS — E119 Type 2 diabetes mellitus without complications: Secondary | ICD-10-CM | POA: Diagnosis not present

## 2017-03-15 DIAGNOSIS — I482 Chronic atrial fibrillation: Secondary | ICD-10-CM | POA: Diagnosis not present

## 2017-03-15 DIAGNOSIS — I10 Essential (primary) hypertension: Secondary | ICD-10-CM | POA: Diagnosis not present

## 2017-03-23 DIAGNOSIS — R1084 Generalized abdominal pain: Secondary | ICD-10-CM | POA: Diagnosis not present

## 2017-03-23 DIAGNOSIS — K2900 Acute gastritis without bleeding: Secondary | ICD-10-CM | POA: Diagnosis not present

## 2017-03-23 DIAGNOSIS — R141 Gas pain: Secondary | ICD-10-CM | POA: Diagnosis not present

## 2017-03-26 DIAGNOSIS — I10 Essential (primary) hypertension: Secondary | ICD-10-CM | POA: Diagnosis not present

## 2017-03-26 DIAGNOSIS — E782 Mixed hyperlipidemia: Secondary | ICD-10-CM | POA: Diagnosis not present

## 2017-03-26 DIAGNOSIS — I480 Paroxysmal atrial fibrillation: Secondary | ICD-10-CM | POA: Diagnosis not present

## 2017-03-26 DIAGNOSIS — R001 Bradycardia, unspecified: Secondary | ICD-10-CM | POA: Diagnosis not present

## 2017-04-30 DIAGNOSIS — R001 Bradycardia, unspecified: Secondary | ICD-10-CM | POA: Diagnosis not present

## 2017-04-30 DIAGNOSIS — I480 Paroxysmal atrial fibrillation: Secondary | ICD-10-CM | POA: Diagnosis not present

## 2017-04-30 DIAGNOSIS — I10 Essential (primary) hypertension: Secondary | ICD-10-CM | POA: Diagnosis not present

## 2017-05-04 DIAGNOSIS — R1084 Generalized abdominal pain: Secondary | ICD-10-CM | POA: Diagnosis not present

## 2017-05-14 DIAGNOSIS — R1031 Right lower quadrant pain: Secondary | ICD-10-CM | POA: Diagnosis not present

## 2017-05-14 DIAGNOSIS — R1032 Left lower quadrant pain: Secondary | ICD-10-CM | POA: Diagnosis not present

## 2017-06-07 DIAGNOSIS — H524 Presbyopia: Secondary | ICD-10-CM | POA: Diagnosis not present

## 2017-06-07 DIAGNOSIS — H21233 Degeneration of iris (pigmentary), bilateral: Secondary | ICD-10-CM | POA: Diagnosis not present

## 2017-06-07 DIAGNOSIS — H2513 Age-related nuclear cataract, bilateral: Secondary | ICD-10-CM | POA: Diagnosis not present

## 2017-06-07 DIAGNOSIS — H25013 Cortical age-related cataract, bilateral: Secondary | ICD-10-CM | POA: Diagnosis not present

## 2017-06-07 DIAGNOSIS — E119 Type 2 diabetes mellitus without complications: Secondary | ICD-10-CM | POA: Diagnosis not present

## 2017-07-09 DIAGNOSIS — I10 Essential (primary) hypertension: Secondary | ICD-10-CM | POA: Diagnosis not present

## 2017-07-09 DIAGNOSIS — I480 Paroxysmal atrial fibrillation: Secondary | ICD-10-CM | POA: Diagnosis not present

## 2017-07-09 DIAGNOSIS — E782 Mixed hyperlipidemia: Secondary | ICD-10-CM | POA: Diagnosis not present

## 2017-07-13 DIAGNOSIS — R55 Syncope and collapse: Secondary | ICD-10-CM | POA: Diagnosis not present

## 2017-07-23 DIAGNOSIS — R109 Unspecified abdominal pain: Secondary | ICD-10-CM

## 2017-07-23 HISTORY — DX: Unspecified abdominal pain: R10.9

## 2017-08-09 DIAGNOSIS — E119 Type 2 diabetes mellitus without complications: Secondary | ICD-10-CM | POA: Diagnosis not present

## 2017-08-09 DIAGNOSIS — I482 Chronic atrial fibrillation: Secondary | ICD-10-CM | POA: Diagnosis not present

## 2017-08-09 DIAGNOSIS — I10 Essential (primary) hypertension: Secondary | ICD-10-CM | POA: Diagnosis not present

## 2017-08-09 DIAGNOSIS — E782 Mixed hyperlipidemia: Secondary | ICD-10-CM | POA: Diagnosis not present

## 2017-08-14 DIAGNOSIS — I480 Paroxysmal atrial fibrillation: Secondary | ICD-10-CM | POA: Diagnosis not present

## 2017-08-14 DIAGNOSIS — K4090 Unilateral inguinal hernia, without obstruction or gangrene, not specified as recurrent: Secondary | ICD-10-CM | POA: Diagnosis not present

## 2017-08-14 DIAGNOSIS — Z7901 Long term (current) use of anticoagulants: Secondary | ICD-10-CM | POA: Diagnosis not present

## 2017-08-15 ENCOUNTER — Other Ambulatory Visit: Payer: Self-pay

## 2017-08-15 ENCOUNTER — Encounter
Admission: RE | Admit: 2017-08-15 | Discharge: 2017-08-15 | Disposition: A | Discharge status: Home / Self Care | Payer: Medicare Other | Source: Ambulatory Visit | Attending: General Surgery | Admitting: General Surgery

## 2017-08-15 ENCOUNTER — Ambulatory Visit: Payer: Self-pay | Admitting: General Surgery

## 2017-08-15 HISTORY — DX: Essential (primary) hypertension: I10

## 2017-08-15 HISTORY — DX: Type 2 diabetes mellitus without complications: E11.9

## 2017-08-15 NOTE — H&P (View-Only) (Signed)
HISTORY OF PRESENT ILLNESS:    Harold Pennington is a 77 y.o.male patient who comes for follow up of his right inguinal hernia.  Patient previously evaluated by me on 02/20/17 due to the right inguinal hernia. The patient at that time had minor symptoms and wanted to avoid surgery. Recently the patient has been having more pain than usual specially when doing heavy lifting. 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 The patient refers the pain is on the inguinal area and sometimes radiates to the right lower extremity. Pain aggravated by climbing stairs and doing heavy lifting. Pain is improved with resting.  At this moment the pain is 1/10. The worst pain has been 6/10. Denies episodes of abdominal distention, nausea or vomiting.       PAST MEDICAL HISTORY:      Past Medical History:  Diagnosis Date  . Benign essential HTN 08/17/2016  . Bradycardia 10/02/2016  . Hyperlipidemia, mixed 08/17/2016  . Paroxysmal A-fib (CMS-HCC) 10/30/2016        PAST SURGICAL HISTORY:   History reviewed. No pertinent surgical history.       MEDICATIONS:  EncounterMedications        Outpatient Encounter Medications as of 08/14/2017  Medication Sig Dispense Refill  . acetaminophen (TYLENOL ARTHRITIS ORAL) Take by mouth 2 (two) times daily    . aluminum-magnesium hydroxide-simethicone, DRAH, (MYLANTA,MI-ACID) 200-200-20 mg/5 mL suspension Take 15 mLs by mouth every 6 (six) hours as needed for Indigestion or Heartburn    . dicyclomine (BENTYL) 10 mg capsule Take 1 capsule (10 mg total) by mouth every 6 (six) hours as needed (for abdominal cramping) 120 capsule 3  . ELIQUIS 5 mg tablet TAKE 1 TABLET BY MOUTH EVERY 12 HOURS 60 tablet 6  . losartan (COZAAR) 50 MG tablet Take 1 tablet (50 mg total) by mouth 2 (two) times daily 180 tablet 1  . metFORMIN (GLUCOPHAGE) 500 MG tablet Take 500 mg by mouth 2 (two) times daily with meals      .  metoprolol succinate (TOPROL-XL) 50 MG XL tablet Take 1 tablet (50 mg total) by mouth once daily 90 tablet 4  . multivitamin-lutein (CENTRUM SILVER) tablet Take 1 tablet by mouth once daily      . rosuvastatin (CRESTOR) 10 MG tablet Take 1 tablet (10 mg total) by mouth once daily 90 tablet 3  . simethicone (GAS-X ORAL) Take 2 tablets by mouth once daily    . trolamine salicylate (ASPERCREME) 10 % cream Apply topically.     No facility-administered encounter medications on file as of 08/14/2017.        ALLERGIES:   Hydrocodone and Xarelto [rivaroxaban]   SOCIAL HISTORY:  Social History          Socioeconomic History  . Marital status: Widowed    Spouse name: Not on file  . Number of children: Not on file  . Years of education: Not on file  . Highest education level: Not on file  Occupational History  . Not on file  Social Needs  . Financial resource strain: Not on file  . Food insecurity:    Worry: Not on file    Inability: Not on file  . Transportation needs:    Medical: Not on file    Non-medical: Not on file  Tobacco Use  . Smoking status: Former Smoker  . Smokeless tobacco: Never Used  Substance and Sexual Activity  .   Alcohol use: Yes    Comment: rarely  . Drug use: Never  . Sexual activity: Not on file  Other Topics Concern  . Not on file  Social History Narrative  . Not on file      FAMILY HISTORY:       Family History  Problem Relation Age of Onset  . Coronary Artery Disease (Blocked arteries around heart) Father   . Diabetes Father   . Coronary Artery Disease (Blocked arteries around heart) Brother   . Stroke Maternal Grandfather   . Coronary Artery Disease (Blocked arteries around heart) Paternal Grandfather      GENERAL REVIEW OF SYSTEMS:   General ROS: negative for - chills, fatigue, fever, weight gain or weight loss Allergy and Immunology ROS: negative for - hives  Hematological and Lymphatic ROS: negative for -  bleeding problems or bruising, negative for palpable nodes Endocrine ROS: negative for - heat or cold intolerance, hair changes Respiratory ROS: negative for - cough, shortness of breath or wheezing Cardiovascular ROS: no chest pain. Positive for palpitations GI ROS: negative for nausea, vomiting, abdominal pain, diarrhea, constipation Musculoskeletal ROS: negative for - joint swelling or muscle pain. Refers right lower extremity pain.  Neurological ROS: negative for - confusion, syncope Dermatological ROS: negative for pruritus and rash  PHYSICAL EXAM:     Vitals:   08/14/17 0835  BP: 133/84  Pulse: 71  Temp: 36.1 C (97 F)  .  Ht:175.3 cm (5' 9") Wt:69.4 kg (153 lb) BSA:Body surface area is 1.84 meters squared. Body mass index is 22.59 kg/m..   GENERAL: Alert, active, oriented x3  HEENT: Pupils equal reactive to light. Extraocular movements are intact. Sclera clear. Palpebral conjunctiva normal red color.Pharynx clear.  NECK: Supple with no palpable mass and no adenopathy.  LUNGS: Sound clear with no rales rhonchi or wheezes.  HEART: Regular rhythm S1 and S2 without murmur.  ABDOMEN: Soft and depressible, nontender with no palpable mass, no hepatomegaly. Right inguinal hernia, soft, reducible, minimal tender upon reduction.   EXTREMITIES: Well-developed well-nourished symmetrical with no dependent edema.  NEUROLOGICAL: Awake alert oriented, facial expression symmetrical, moving all extremities.      IMPRESSION:     Harold Pennington is a 77 year old male presenting for re evaluation for right inguinal hernia.    The patient presents with a more symptomatic, reducible inguinal hernia. Patient was oriented about the diagnosis of inguinal hernia again and its implication. The patient was oriented again about the treatment alternatives (observation vs surgical repair). Due to patient symptoms, repair is recommended. Patient oriented about the surgical procedure, the use of  mesh and its risk of complications such as: infection, bleeding, injury to vas deference, vasculature and testicle, injury to bowel or bladder, and chronic pain.  Patient oriented about why his case was going to be more difficult due to the current anticoagulation therapy due to afib. Patient will need clearance from cardiologist and stop the Eliquis 3 days before surgery. Patient also with difficult social situation. He refers that he only has a daughter and that she is taking care of her step father and her mother in law. In addition he is very worried about the cats that he has to take care. Patient oriented that he needs to be with someone taking care of him for at least the first 24 hours. He refers that he does not has that alternative. For this reason the patient has to be admitted for observation for at least 24 hours. That   is better for him since he is a higher risk of hematoma that will need close observation.            PLAN:  1. Right inguinal hernia repair with mesh (49505) 2. Admit to stay overnight after surgery.  3. Stop Eliquis 3 days before surgery  4. CBC, CMP  5. Cardiology clearance 6. Contact us if has any question or concern.   Patient verbalized understanding, all questions were answered, and were agreeable with the plan outlined above.   Harold Helinski Cintron-Diaz, MD  Electronically signed by Harold Angeletti Cintron-Diaz, MD  

## 2017-08-15 NOTE — Patient Instructions (Signed)
Your procedure is scheduled on: July 29 , 2019 MONDAY Report to Day Surgery on the [facility]nd [facility] To find out your arrival time, please call [phone] between 1PM - 3PM on: Friday August 17, 2017  REMEMBER: Instructions that are not followed completely may result in serious medical risk, up to and including death; or upon the discretion of your surgeon and anesthesiologist your surgery may need to be rescheduled.  Do not eat food after midnight the night before your procedure.  No gum chewing, lozengers or hard candies.  You may however, drink CLEAR liquids up to 2 hours before you are scheduled to arrive for your surgery. Do not drink anything within 2 hours of the start of your surgery.  Clear liquids include: - water   Do NOT drink anything that is not on this list.  Type 1 and Type 2 diabetics should only drink water.  No Alcohol for 24 hours before or after surgery.  No Smoking including e-cigarettes for 24 hours prior to surgery.  No chewable tobacco products for at least 6 hours prior to surgery.  No nicotine patches on the day of surgery.  On the morning of surgery brush your teeth with toothpaste and water, you may rinse your mouth with mouthwash if you wish. Do not swallow any toothpaste or mouthwash.  Notify your doctor if there is any change in your medical condition (cold, fever, infection).  Do not wear jewelry, make-up, hairpins, clips or nail polish.  Do not wear lotions, powders, or perfumes. You may NOT wear deodorant.  Do not shave 48 hours prior to surgery. Men may shave face and neck.  Contacts and dentures may not be worn into surgery.  Do not bring valuables to the hospital, including drivers license, insurance or credit cards.  [facility] is not responsible for any belongings or valuables.   TAKE THESE MEDICATIONS THE MORNING OF SURGERY: METOPROLOL  Use CHG Soap or wipes as directed on instruction sheet.  Stop Metformin 2 days  prior to surgery. LAST DOSE August 17, 2017  Follow recommendations from Cardiologist, Pulmonologist or PCP regarding stopping Eliquis. LAST DOSE ON  August 16, 2017  Stop Anti-inflammatories (NSAIDS) such as Advil, Aleve, Ibuprofen, Motrin, Naproxen, Naprosyn and Aspirin based products such as Excedrin, Goodys Powder, BC Powder. (May take Tylenol or Acetaminophen if needed.)  Stop ANY OVER THE COUNTER supplements until after surgery. (May continue Vitamin D, Vitamin B, and multivitamin.)  Wear comfortable clothing (specific to your surgery type) to the hospital.  Plan for stool softeners for home use.  If you are being admitted to the hospital overnight, leave your suitcase in the car. After surgery it may be brought to your room.  If you are being discharged the day of surgery, you will not be allowed to drive home. You will need a responsible adult to drive you home and stay with you that night.   If you are taking public transportation, you will need to have a responsible adult with you. Please confirm with your physician that it is acceptable to use public transportation.   Please call [phone] if you have any questions about these instructions.

## 2017-08-15 NOTE — H&P (Signed)
HISTORY OF PRESENT ILLNESS:    [name] is a 77 y.o.male patient who comes for follow up of his right inguinal hernia.  Patient previously evaluated by me on 02/20/17 due to the right inguinal hernia. The patient at that time had minor symptoms and wanted to avoid surgery. Recently the patient has been having more pain than usual specially when doing heavy lifting. 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 The patient refers the pain is on the inguinal area and sometimes radiates to the right lower extremity. Pain aggravated by climbing stairs and doing heavy lifting. Pain is improved with resting.  At this moment the pain is 1/10. The worst pain has been 6/10. Denies episodes of abdominal distention, nausea or vomiting.       PAST MEDICAL HISTORY:      Past Medical History:  Diagnosis Date  . Benign essential HTN 08/17/2016  . Bradycardia 10/02/2016  . Hyperlipidemia, mixed 08/17/2016  . Paroxysmal A-fib (CMS-HCC) 10/30/2016        PAST SURGICAL HISTORY:   History reviewed. No pertinent surgical history.       MEDICATIONS:  EncounterMedications        Outpatient Encounter Medications as of 08/14/2017  Medication Sig Dispense Refill  . acetaminophen (TYLENOL ARTHRITIS ORAL) Take by mouth 2 (two) times daily    . aluminum-magnesium hydroxide-simethicone, DRAH, (MYLANTA,MI-ACID) 200-200-20 mg/5 mL suspension Take 15 mLs by mouth every 6 (six) hours as needed for Indigestion or Heartburn    . dicyclomine (BENTYL) 10 mg capsule Take 1 capsule (10 mg total) by mouth every 6 (six) hours as needed (for abdominal cramping) 120 capsule 3  . ELIQUIS 5 mg tablet TAKE 1 TABLET BY MOUTH EVERY 12 HOURS 60 tablet 6  . losartan (COZAAR) 50 MG tablet Take 1 tablet (50 mg total) by mouth 2 (two) times daily 180 tablet 1  . metFORMIN (GLUCOPHAGE) 500 MG tablet Take 500 mg by mouth 2 (two) times daily with meals      .  metoprolol succinate (TOPROL-XL) 50 MG XL tablet Take 1 tablet (50 mg total) by mouth once daily 90 tablet 4  . multivitamin-lutein (CENTRUM SILVER) tablet Take 1 tablet by mouth once daily      . rosuvastatin (CRESTOR) 10 MG tablet Take 1 tablet (10 mg total) by mouth once daily 90 tablet 3  . simethicone (GAS-X ORAL) Take 2 tablets by mouth once daily    . trolamine salicylate (ASPERCREME) 10 % cream Apply topically.     No facility-administered encounter medications on file as of 08/14/2017.        ALLERGIES:   Hydrocodone and Xarelto [rivaroxaban]   SOCIAL HISTORY:  Social History          Socioeconomic History  . Marital status: Widowed    Spouse name: Not on file  . Number of children: Not on file  . Years of education: Not on file  . Highest education level: Not on file  Occupational History  . Not on file  Social Needs  . Financial resource strain: Not on file  . Food insecurity:    Worry: Not on file    Inability: Not on file  . Transportation needs:    Medical: Not on file    Non-medical: Not on file  Tobacco Use  . Smoking status: Former Games developermoker  . Smokeless tobacco: Never Used  Substance and Sexual Activity  .  Alcohol use: Yes    Comment: rarely  . Drug use: Never  . Sexual activity: Not on file  Other Topics Concern  . Not on file  Social History Narrative  . Not on file      FAMILY HISTORY:       Family History  Problem Relation Age of Onset  . Coronary Artery Disease (Blocked arteries around heart) Father   . Diabetes Father   . Coronary Artery Disease (Blocked arteries around heart) Brother   . Stroke Maternal Grandfather   . Coronary Artery Disease (Blocked arteries around heart) Paternal Grandfather      GENERAL REVIEW OF SYSTEMS:   General ROS: negative for - chills, fatigue, fever, weight gain or weight loss Allergy and Immunology ROS: negative for - hives  Hematological and Lymphatic ROS: negative for -  bleeding problems or bruising, negative for palpable nodes Endocrine ROS: negative for - heat or cold intolerance, hair changes Respiratory ROS: negative for - cough, shortness of breath or wheezing Cardiovascular ROS: no chest pain. Positive for palpitations GI ROS: negative for nausea, vomiting, abdominal pain, diarrhea, constipation Musculoskeletal ROS: negative for - joint swelling or muscle pain. Refers right lower extremity pain.  Neurological ROS: negative for - confusion, syncope Dermatological ROS: negative for pruritus and rash  PHYSICAL EXAM:     Vitals:   08/14/17 0835  BP: 133/84  Pulse: 71  Temp: 36.1 C (97 F)  .  Ht:175.3 cm (5\' 9" ) Wt:69.4 kg (153 lb) ZOX:WRUE surface area is 1.84 meters squared. Body mass index is 22.59 kg/m.Marland Kitchen   GENERAL: Alert, active, oriented x3  HEENT: Pupils equal reactive to light. Extraocular movements are intact. Sclera clear. Palpebral conjunctiva normal red color.Pharynx clear.  NECK: Supple with no palpable mass and no adenopathy.  LUNGS: Sound clear with no rales rhonchi or wheezes.  HEART: Regular rhythm S1 and S2 without murmur.  ABDOMEN: Soft and depressible, nontender with no palpable mass, no hepatomegaly. Right inguinal hernia, soft, reducible, minimal tender upon reduction.   EXTREMITIES: Well-developed well-nourished symmetrical with no dependent edema.  NEUROLOGICAL: Awake alert oriented, facial expression symmetrical, moving all extremities.      IMPRESSION:     [name] is a 77 year old male presenting for re evaluation for right inguinal hernia.    The patient presents with a more symptomatic, reducible inguinal hernia. Patient was oriented about the diagnosis of inguinal hernia again and its implication. The patient was oriented again about the treatment alternatives (observation vs surgical repair). Due to patient symptoms, repair is recommended. Patient oriented about the surgical procedure, the use of  mesh and its risk of complications such as: infection, bleeding, injury to vas deference, vasculature and testicle, injury to bowel or bladder, and chronic pain.  Patient oriented about why his case was going to be more difficult due to the current anticoagulation therapy due to afib. Patient will need clearance from cardiologist and stop the Eliquis 3 days before surgery. Patient also with difficult social situation. He refers that he only has a daughter and that she is taking care of her step father and her mother in law. In addition he is very worried about the cats that he has to take care. Patient oriented that he needs to be with someone taking care of him for at least the first 24 hours. He refers that he does not has that alternative. For this reason the patient has to be admitted for observation for at least 24 hours. That  is better for him since he is a higher risk of hematoma that will need close observation.            PLAN:  1. Right inguinal hernia repair with mesh (16109) 2. Admit to stay overnight after surgery.  3. Stop Eliquis 3 days before surgery  4. CBC, CMP  5. Cardiology clearance 6. Contact us if has any question or concern.   Patient verbalized understanding, all questions were answered, and were agreeable with the plan outlined above.   Carolan Shiver, MD  Electronically signed by Carolan Shiver, MD

## 2017-08-16 DIAGNOSIS — E119 Type 2 diabetes mellitus without complications: Secondary | ICD-10-CM | POA: Diagnosis not present

## 2017-08-16 DIAGNOSIS — I10 Essential (primary) hypertension: Secondary | ICD-10-CM | POA: Diagnosis not present

## 2017-08-16 DIAGNOSIS — E782 Mixed hyperlipidemia: Secondary | ICD-10-CM | POA: Diagnosis not present

## 2017-08-16 DIAGNOSIS — I482 Chronic atrial fibrillation: Secondary | ICD-10-CM | POA: Diagnosis not present

## 2017-08-19 MED ORDER — CEFAZOLIN SODIUM-DEXTROSE 2-4 GM/100ML-% IV SOLN
2.0000 g | INTRAVENOUS | Status: AC
  Administered 2017-08-20: 2 g via INTRAVENOUS

## 2017-08-20 ENCOUNTER — Ambulatory Visit: Payer: Medicare Other

## 2017-08-20 ENCOUNTER — Observation Stay
Admission: RE | Admit: 2017-08-20 | Discharge: 2017-08-21 | Disposition: A | Discharge status: Home / Self Care | Payer: Medicare Other | Source: Ambulatory Visit | Attending: General Surgery | Admitting: General Surgery

## 2017-08-20 ENCOUNTER — Encounter
Admission: RE | Disposition: A | Discharge status: Home / Self Care | Payer: Self-pay | Source: Ambulatory Visit | Attending: General Surgery

## 2017-08-20 ENCOUNTER — Other Ambulatory Visit: Payer: Self-pay

## 2017-08-20 ENCOUNTER — Encounter: Payer: Self-pay | Admitting: *Deleted

## 2017-08-20 DIAGNOSIS — Z7901 Long term (current) use of anticoagulants: Secondary | ICD-10-CM | POA: Diagnosis not present

## 2017-08-20 DIAGNOSIS — K4090 Unilateral inguinal hernia, without obstruction or gangrene, not specified as recurrent: Principal | ICD-10-CM

## 2017-08-20 DIAGNOSIS — Z87891 Personal history of nicotine dependence: Secondary | ICD-10-CM | POA: Insufficient documentation

## 2017-08-20 DIAGNOSIS — E119 Type 2 diabetes mellitus without complications: Secondary | ICD-10-CM | POA: Diagnosis not present

## 2017-08-20 DIAGNOSIS — K219 Gastro-esophageal reflux disease without esophagitis: Secondary | ICD-10-CM | POA: Insufficient documentation

## 2017-08-20 DIAGNOSIS — Z79899 Other long term (current) drug therapy: Secondary | ICD-10-CM | POA: Insufficient documentation

## 2017-08-20 DIAGNOSIS — I480 Paroxysmal atrial fibrillation: Secondary | ICD-10-CM | POA: Diagnosis not present

## 2017-08-20 DIAGNOSIS — Z7984 Long term (current) use of oral hypoglycemic drugs: Secondary | ICD-10-CM | POA: Diagnosis not present

## 2017-08-20 DIAGNOSIS — I10 Essential (primary) hypertension: Secondary | ICD-10-CM | POA: Diagnosis not present

## 2017-08-20 DIAGNOSIS — K4030 Unilateral inguinal hernia, with obstruction, without gangrene, not specified as recurrent: Secondary | ICD-10-CM | POA: Diagnosis not present

## 2017-08-20 HISTORY — PX: INGUINAL HERNIA REPAIR: SHX194

## 2017-08-20 HISTORY — DX: Unspecified abdominal pain: R10.9

## 2017-08-20 LAB — GLUCOSE, CAPILLARY
GLUCOSE-CAPILLARY: 120 mg/dL — AB (ref 70–99)
Glucose-Capillary: 158 mg/dL — ABNORMAL HIGH (ref 70–99)

## 2017-08-20 SURGERY — REPAIR, HERNIA, INGUINAL, ADULT
Anesthesia: General | Site: Inguinal | Laterality: Right | Wound class: "Clean "

## 2017-08-20 MED ORDER — FAMOTIDINE 20 MG PO TABS
ORAL_TABLET | ORAL | Status: AC
Start: 2017-08-20 — End: 2017-08-20
  Filled 2017-08-20: qty 1

## 2017-08-20 MED ORDER — FENTANYL CITRATE (PF) 100 MCG/2ML IJ SOLN
INTRAMUSCULAR | Status: AC
  Filled 2017-08-20: qty 2

## 2017-08-20 MED ORDER — FENTANYL CITRATE (PF) 100 MCG/2ML IJ SOLN
INTRAMUSCULAR | Status: DC | PRN
  Administered 2017-08-20 (×6): 25 ug via INTRAVENOUS

## 2017-08-20 MED ORDER — FAMOTIDINE 20 MG PO TABS
20.0000 mg | ORAL_TABLET | Freq: Once | ORAL | Status: AC
  Administered 2017-08-20: 20 mg via ORAL

## 2017-08-20 MED ORDER — DEXAMETHASONE SODIUM PHOSPHATE 10 MG/ML IJ SOLN
INTRAMUSCULAR | Status: DC | PRN
  Administered 2017-08-20: 5 mg via INTRAVENOUS

## 2017-08-20 MED ORDER — TRAMADOL HCL 50 MG PO TABS
50.0000 mg | ORAL_TABLET | Freq: Four times a day (QID) | ORAL | Status: DC | PRN
  Administered 2017-08-20: 50 mg via ORAL
  Filled 2017-08-20: qty 1

## 2017-08-20 MED ORDER — EPHEDRINE SULFATE 50 MG/ML IJ SOLN
INTRAMUSCULAR | Status: DC | PRN
  Administered 2017-08-20: 2.5 mg via INTRAVENOUS
  Administered 2017-08-20: 5 mg via INTRAVENOUS
  Administered 2017-08-20: 2.5 mg via INTRAVENOUS

## 2017-08-20 MED ORDER — ACETAMINOPHEN 10 MG/ML IV SOLN
INTRAVENOUS | Status: DC | PRN
  Administered 2017-08-20: 1000 mg via INTRAVENOUS

## 2017-08-20 MED ORDER — FENTANYL CITRATE (PF) 100 MCG/2ML IJ SOLN
INTRAMUSCULAR | Status: AC
  Administered 2017-08-20: 25 ug via INTRAVENOUS
  Filled 2017-08-20: qty 2

## 2017-08-20 MED ORDER — ENOXAPARIN SODIUM 40 MG/0.4ML ~~LOC~~ SOLN
40.0000 mg | SUBCUTANEOUS | Status: DC
  Administered 2017-08-21: 40 mg via SUBCUTANEOUS
  Filled 2017-08-20: qty 0.4

## 2017-08-20 MED ORDER — PROPOFOL 10 MG/ML IV BOLUS
INTRAVENOUS | Status: AC
  Filled 2017-08-20: qty 20

## 2017-08-20 MED ORDER — BUPIVACAINE-EPINEPHRINE (PF) 0.25% -1:200000 IJ SOLN
INTRAMUSCULAR | Status: AC
  Filled 2017-08-20: qty 30

## 2017-08-20 MED ORDER — DICYCLOMINE HCL 10 MG PO CAPS
10.0000 mg | ORAL_CAPSULE | Freq: Every day | ORAL | Status: DC
  Administered 2017-08-20 – 2017-08-21 (×2): 10 mg via ORAL
  Filled 2017-08-20 (×2): qty 1

## 2017-08-20 MED ORDER — PHENYLEPHRINE HCL 10 MG/ML IJ SOLN
INTRAMUSCULAR | Status: AC
  Filled 2017-08-20: qty 1

## 2017-08-20 MED ORDER — LIDOCAINE HCL (PF) 2 % IJ SOLN
INTRAMUSCULAR | Status: DC | PRN
  Administered 2017-08-20: 60 mg

## 2017-08-20 MED ORDER — PROPOFOL 10 MG/ML IV BOLUS
INTRAVENOUS | Status: DC | PRN
  Administered 2017-08-20: 150 mg via INTRAVENOUS

## 2017-08-20 MED ORDER — ONDANSETRON HCL 4 MG/2ML IJ SOLN
INTRAMUSCULAR | Status: AC
  Filled 2017-08-20: qty 2

## 2017-08-20 MED ORDER — MIDAZOLAM HCL 2 MG/2ML IJ SOLN
INTRAMUSCULAR | Status: AC
  Filled 2017-08-20: qty 2

## 2017-08-20 MED ORDER — RISAQUAD PO CAPS
1.0000 | ORAL_CAPSULE | Freq: Every day | ORAL | Status: DC
  Administered 2017-08-20 – 2017-08-21 (×2): 1 via ORAL
  Filled 2017-08-20 (×2): qty 1

## 2017-08-20 MED ORDER — METFORMIN HCL 500 MG PO TABS
1000.0000 mg | ORAL_TABLET | Freq: Two times a day (BID) | ORAL | Status: DC
  Administered 2017-08-20 – 2017-08-21 (×2): 1000 mg via ORAL
  Filled 2017-08-20 (×2): qty 2

## 2017-08-20 MED ORDER — ONDANSETRON HCL 4 MG/2ML IJ SOLN: 4.0000 mg | Freq: Once | INTRAMUSCULAR | Status: DC | PRN

## 2017-08-20 MED ORDER — SODIUM CHLORIDE 0.9 % IV SOLN
INTRAVENOUS | Status: DC | PRN
  Administered 2017-08-20: 20 ug/min via INTRAVENOUS

## 2017-08-20 MED ORDER — DEXAMETHASONE SODIUM PHOSPHATE 10 MG/ML IJ SOLN
INTRAMUSCULAR | Status: AC
  Filled 2017-08-20: qty 1

## 2017-08-20 MED ORDER — ACETAMINOPHEN 10 MG/ML IV SOLN
INTRAVENOUS | Status: AC
  Filled 2017-08-20: qty 100

## 2017-08-20 MED ORDER — LIDOCAINE HCL (PF) 2 % IJ SOLN
INTRAMUSCULAR | Status: AC
  Filled 2017-08-20: qty 10

## 2017-08-20 MED ORDER — ACETAMINOPHEN 325 MG PO TABS: 650.0000 mg | ORAL_TABLET | Freq: Two times a day (BID) | ORAL | Status: DC | PRN

## 2017-08-20 MED ORDER — ROSUVASTATIN CALCIUM 10 MG PO TABS
10.0000 mg | ORAL_TABLET | Freq: Every day | ORAL | Status: DC
  Administered 2017-08-20: 10 mg via ORAL
  Filled 2017-08-20 (×2): qty 1

## 2017-08-20 MED ORDER — SODIUM CHLORIDE 0.9 % IJ SOLN
INTRAMUSCULAR | Status: AC
  Filled 2017-08-20: qty 10

## 2017-08-20 MED ORDER — FENTANYL CITRATE (PF) 100 MCG/2ML IJ SOLN: 12.5000 ug | INTRAMUSCULAR | Status: DC | PRN

## 2017-08-20 MED ORDER — PHENYLEPHRINE HCL 10 MG/ML IJ SOLN
INTRAMUSCULAR | Status: DC | PRN
  Administered 2017-08-20 (×2): 100 ug via INTRAVENOUS

## 2017-08-20 MED ORDER — ADULT MULTIVITAMIN W/MINERALS CH
1.0000 | ORAL_TABLET | Freq: Every day | ORAL | Status: DC
  Administered 2017-08-20 – 2017-08-21 (×2): 1 via ORAL
  Filled 2017-08-20 (×2): qty 1

## 2017-08-20 MED ORDER — GLYCOPYRROLATE 0.2 MG/ML IJ SOLN
INTRAMUSCULAR | Status: DC | PRN
  Administered 2017-08-20 (×2): 0.1 mg via INTRAVENOUS

## 2017-08-20 MED ORDER — CEFAZOLIN SODIUM-DEXTROSE 2-4 GM/100ML-% IV SOLN
INTRAVENOUS | Status: AC
  Filled 2017-08-20: qty 100

## 2017-08-20 MED ORDER — METOPROLOL SUCCINATE ER 50 MG PO TB24
50.0000 mg | ORAL_TABLET | Freq: Every day | ORAL | Status: DC
  Administered 2017-08-21: 50 mg via ORAL
  Filled 2017-08-20: qty 1

## 2017-08-20 MED ORDER — EPHEDRINE SULFATE 50 MG/ML IJ SOLN
INTRAMUSCULAR | Status: AC
  Filled 2017-08-20: qty 1

## 2017-08-20 MED ORDER — MIDAZOLAM HCL 5 MG/5ML IJ SOLN
INTRAMUSCULAR | Status: DC | PRN
  Administered 2017-08-20: 1 mg via INTRAVENOUS

## 2017-08-20 MED ORDER — LOSARTAN POTASSIUM 50 MG PO TABS
50.0000 mg | ORAL_TABLET | Freq: Two times a day (BID) | ORAL | Status: DC
  Administered 2017-08-20 – 2017-08-21 (×2): 50 mg via ORAL
  Filled 2017-08-20 (×3): qty 1

## 2017-08-20 MED ORDER — BUPIVACAINE-EPINEPHRINE 0.25% -1:200000 IJ SOLN
INTRAMUSCULAR | Status: DC | PRN
  Administered 2017-08-20: 30 mL

## 2017-08-20 MED ORDER — SODIUM CHLORIDE 0.9 % IV SOLN
INTRAVENOUS | Status: DC
  Administered 2017-08-20: 09:00:00 via INTRAVENOUS
  Administered 2017-08-20: 50 mL/h via INTRAVENOUS

## 2017-08-20 MED ORDER — FAMOTIDINE IN NACL 20-0.9 MG/50ML-% IV SOLN
20.0000 mg | INTRAVENOUS | Status: DC
  Administered 2017-08-20: 20 mg via INTRAVENOUS
  Filled 2017-08-20: qty 50

## 2017-08-20 MED ORDER — ONDANSETRON HCL 4 MG/2ML IJ SOLN
INTRAMUSCULAR | Status: DC | PRN
  Administered 2017-08-20: 4 mg via INTRAVENOUS

## 2017-08-20 MED ORDER — FENTANYL CITRATE (PF) 100 MCG/2ML IJ SOLN
25.0000 ug | INTRAMUSCULAR | Status: DC | PRN
  Administered 2017-08-20 (×4): 25 ug via INTRAVENOUS

## 2017-08-20 MED ORDER — ROCURONIUM BROMIDE 50 MG/5ML IV SOLN
INTRAVENOUS | Status: AC
  Filled 2017-08-20: qty 1

## 2017-08-20 SURGICAL SUPPLY — 30 items
BLADE SURG 15 STRL LF DISP TIS (BLADE) ×1 IMPLANT
BLADE SURG 15 STRL SS (BLADE) ×2
CANISTER SUCT 1200ML W/VALVE (MISCELLANEOUS) ×3 IMPLANT
CHLORAPREP W/TINT 26ML (MISCELLANEOUS) ×3 IMPLANT
DERMABOND ADVANCED (GAUZE/BANDAGES/DRESSINGS) ×2
DERMABOND ADVANCED .7 DNX12 (GAUZE/BANDAGES/DRESSINGS) ×1 IMPLANT
DRAIN PENROSE 1/4X12 LTX (DRAIN) ×3 IMPLANT
DRAPE LAPAROTOMY 100X77 ABD (DRAPES) ×3 IMPLANT
ELECT REM PT RETURN 9FT ADLT (ELECTROSURGICAL) ×3
ELECTRODE REM PT RTRN 9FT ADLT (ELECTROSURGICAL) ×1 IMPLANT
GLOVE BIO SURGEON STRL SZ 6.5 (GLOVE) ×4 IMPLANT
GLOVE BIO SURGEONS STRL SZ 6.5 (GLOVE) ×2
GLOVE BIOGEL PI IND STRL 6.5 (GLOVE) ×1 IMPLANT
GLOVE BIOGEL PI INDICATOR 6.5 (GLOVE) ×2
GOWN STRL REUS W/ TWL LRG LVL3 (GOWN DISPOSABLE) ×2 IMPLANT
GOWN STRL REUS W/TWL LRG LVL3 (GOWN DISPOSABLE) ×4
LABEL OR SOLS (LABEL) ×3 IMPLANT
MESH HERNIA SYS ULTRAPRO LRG (Mesh General) ×2 IMPLANT
NEEDLE HYPO 22GX1.5 SAFETY (NEEDLE) ×3 IMPLANT
NS IRRIG 500ML POUR BTL (IV SOLUTION) ×3 IMPLANT
PACK BASIN MINOR ARMC (MISCELLANEOUS) ×3 IMPLANT
SUT MNCRL 4-0 (SUTURE) ×2
SUT MNCRL 4-0 27XMFL (SUTURE) ×1
SUT SURGILON 0 BLK (SUTURE) ×6 IMPLANT
SUT VIC AB 2-0 BRD 54 (SUTURE) ×3 IMPLANT
SUT VIC AB 2-0 CT2 27 (SUTURE) ×3 IMPLANT
SUT VIC AB 3-0 SH 27 (SUTURE) ×4
SUT VIC AB 3-0 SH 27X BRD (SUTURE) ×2 IMPLANT
SUTURE MNCRL 4-0 27XMF (SUTURE) ×1 IMPLANT
SYR 10ML LL (SYRINGE) ×3 IMPLANT

## 2017-08-20 NOTE — Progress Notes (Signed)
Patient admitted to floor after surgery, Per MD Cintron-Diaz no lab work needed as of now. Assessment WDL, surgical incision and vs stable. Trudee KusterBrandi R Mansfield

## 2017-08-20 NOTE — Op Note (Signed)
Preoperative diagnosis: Right Inguinal Hernia.  Postoperative diagnosis: Right Indirect Inguinal Hernia.  Procedure: Right Inguinal hernia repair with mesh  Anesthesia: General (LMA)  Surgeon: Dr. Hazle Quantintron Diaz  Wound Classification: Clean  Indications:  Patient is a 77 y.o. male developed a symptomatic right inguinal hernia. Repair was indicated to avoid complications of incarceration, obstruction and pain, and a prosthetic mesh repair was elected.  Findings: 1. Vas Deferens and cord structures identified and preserved 2. A indirect inguinal hernia was identified 3. Prolene Hernia System used for repair 4. Adequate hemostasis achieved  Description of procedure: The patient was taken to the operating room. A time-out was completed verifying correct patient, procedure, site, positioning, and implant(s) and/or special equipment prior to beginning this procedure. spinal anesthesia was induced. The right groin was prepped and draped in the usual sterile fashion. An incision was marked in a natural skin crease and planned to end near the pubic tubercle.  The skin crease incision was made with a knife and deepened through Scarpa's and Camper's fascia with electrocautery until the aponeurosis of the external oblique was encountered. This was cleaned and the external ring was exposed. Hemostasis was achieved in the wound. An incision was made in the midportion of the external oblique aponeurosis in the direction of its fibers. The ilioinguinal nerve was identified and protected throughout the dissection. Flaps of the external oblique were developed cephalad and inferiorly.  The cord was identified. It was gently dissected free at the pubic tubercle and encircled with a Penrose drain. Attention was directed to the anteromedial aspect of the cord, where an indirect hernia sac was identified. The sac was carefully dissected free of the cord down to the level of the internal ring. The vas and testicular  vessels were identified and protected from harm. The sac was opened and contents were reduced. A finger was passed into the peritoneal cavity and the floor of the inguinal canal assessed and found to be strong. The femoral canal was palpated and no hernia identified. The sac was twisted and suture ligated with 2-0 vicryl. Redundant sac was excised and submitted to pathology. The stump of the sac was checked for hemostasis and allowed to retract into the abdomen.  Attention then turned to the floor of the canal, which appeared to be grossly weakened without a well-defined defect or sac. The Prolene Hernia System mesh was inserted. Beginning at the pubic tubercle, the mesh was sutured to the inguinal ligament inferiorly and the conjoint tendon superiorly using interrupted 0 nonabsorbable sutures. Care was taken to assure that the mesh was placed in a relaxed fashion to avoid excessive tension and that no neurovascular structures were caught in the repair. Laterally, the tails of the mesh were crossed and the internal ring recreated.  Hemostasis was again checked. The Penrose drain was removed. The external oblique aponeurosis was closed with a running suture of 3-0 Vicryl, taking care not to catch the ilioinguinal nerve in the suture line. Scarpa's fascia was closed with interrupted 3-0 Vicryl.  The skin was closed with a subcuticular stitch of Monocryl 4-0. Dermabond was applied.  The testis was gently pulled down into its anatomic position in the scrotum.  The patient tolerated the procedure well and was taken to the postanesthesia care unit in stable condition.   Specimen: Hernia sac  Complications: None  Estimated Blood Loss: 25 mL

## 2017-08-20 NOTE — Anesthesia Preprocedure Evaluation (Signed)
Anesthesia Evaluation  Patient identified by MRN, date of birth, ID band Patient awake    Reviewed: Allergy & Precautions, NPO status , Patient's Chart, lab work & pertinent test results  History of Anesthesia Complications Negative for: history of anesthetic complications  Airway Mallampati: II       Dental  (+) Missing   Pulmonary neg sleep apnea, neg COPD, former smoker,           Cardiovascular hypertension, Pt. on medications (-) Past MI and (-) CHF (-) dysrhythmias (-) Valvular Problems/Murmurs     Neuro/Psych neg Seizures    GI/Hepatic Neg liver ROS, neg GERD  ,  Endo/Other  diabetes, Type 2, Oral Hypoglycemic Agents  Renal/GU negative Renal ROS     Musculoskeletal   Abdominal   Peds  Hematology   Anesthesia Other Findings   Reproductive/Obstetrics                             Anesthesia Physical Anesthesia Plan  ASA: II  Anesthesia Plan: General   Post-op Pain Management:    Induction: Intravenous  PONV Risk Score and Plan:   Airway Management Planned: LMA and Oral ETT  Additional Equipment:   Intra-op Plan:   Post-operative Plan:   Informed Consent: I have reviewed the patients History and Physical, chart, labs and discussed the procedure including the risks, benefits and alternatives for the proposed anesthesia with the patient or authorized representative who has indicated his/her understanding and acceptance.     Plan Discussed with:   Anesthesia Plan Comments:         Anesthesia Quick Evaluation

## 2017-08-20 NOTE — Anesthesia Post-op Follow-up Note (Signed)
Anesthesia QCDR form completed.        

## 2017-08-20 NOTE — Transfer of Care (Signed)
Immediate Anesthesia Transfer of Care Note  Patient: Harold Pennington  Procedure(s) Performed: HERNIA REPAIR INGUINAL ADULT (Right Inguinal)  Patient Location: PACU  Anesthesia Type:General  Level of Consciousness: sedated  Airway & Oxygen Therapy: Patient Spontanous Breathing and Patient connected to face mask oxygen  Post-op Assessment: Report given to RN and Post -op Vital signs reviewed and stable  Post vital signs: Reviewed  Last Vitals:  Vitals Value Taken Time  BP 113/46 08/20/2017 10:00 AM  Temp    Pulse 70 08/20/2017 10:01 AM  Resp 13 08/20/2017 10:01 AM  SpO2 96 % 08/20/2017 10:01 AM  Vitals shown include unvalidated device data.  Last Pain:  Vitals:   08/20/17 0613  TempSrc: Tympanic  PainSc: 2       Patients Stated Pain Goal: 0 (08/20/17 16100613)  Complications: No apparent anesthesia complications

## 2017-08-20 NOTE — Brief Op Note (Signed)
08/20/2017  10:15 AM  PATIENT:  Harold Pennington  77 y.o. male  PRE-OPERATIVE DIAGNOSIS:   NON RECURRENT INGUINAL HERNIA  POST-OPERATIVE DIAGNOSIS:  NON RECURRENT INGUINAL HERNIA  PROCEDURE:  Procedure(s): HERNIA REPAIR INGUINAL ADULT (Right)  SURGEON:  Surgeon(s) and Role:    * Carolan Shiverintron-Diaz, Hava Massingale, MD - Primary  ANESTHESIA:   general  EBL:  20 mL    PATIENT DISPOSITION:  PACU - hemodynamically stable. Patient on chronic anticoagulation with no one to take care of him the first 24 hours after surgery. Patient needs to stay for observation and pain control.

## 2017-08-20 NOTE — Progress Notes (Signed)
Inpatient Diabetes Program Recommendations  AACE/ADA: New Consensus Statement on Inpatient Glycemic Control (2019)  Target Ranges:  Prepandial:   less than 140 mg/dL      Peak postprandial:   less than 180 mg/dL (1-2 hours)      Critically ill patients:  140 - 180 mg/dL   Results for Harold Pennington, Harold Pennington (MRN [id number]) as of 08/20/2017 12:18  Ref. Range 08/20/2017 06:35 08/20/2017 10:20  Glucose-Capillary Latest Ref Range: 70 - 99 mg/dL 409120 (H) 811158 (H)   Review of Glycemic Control  Diabetes history: DM2 Outpatient Diabetes medications: Metformin 1000 mg BID Current orders for Inpatient glycemic control: Metformin 1000 mg BID  Inpatient Diabetes Program Recommendations: Correction (SSI): While inpatient, please consider ordering CBGs with Novolog 0-9 units TID with meals and Novolog 0-5 units QHS.   Thanks, Orlando PennerMarie Lynard Postlewait, RN, MSN, CDE Diabetes Coordinator Inpatient Diabetes Program [phone] (Team Pager from 8am to 5pm)

## 2017-08-20 NOTE — Anesthesia Postprocedure Evaluation (Signed)
Anesthesia Post Note  Patient: Harold Pennington  Procedure(s) Performed: HERNIA REPAIR INGUINAL ADULT (Right Inguinal)  Patient location during evaluation: PACU Anesthesia Type: General Level of consciousness: awake and alert Pain management: pain level controlled Vital Signs Assessment: post-procedure vital signs reviewed and stable Respiratory status: spontaneous breathing and respiratory function stable Cardiovascular status: blood pressure returned to baseline and stable Anesthetic complications: no     Last Vitals:  Vitals:   08/20/17 1000 08/20/17 1015  BP: (!) 113/46 (!) 121/56  Pulse: 71 80  Resp: 13 15  Temp: 36.8 C   SpO2: 96% 100%    Last Pain:  Vitals:   08/20/17 1015  TempSrc:   PainSc: 0-No pain                 KEPHART,WILLIAM K

## 2017-08-20 NOTE — Interval H&P Note (Signed)
History and Physical Interval Note:  08/20/2017 7:12 AM  Harold Pennington  has presented today for surgery, with the diagnosis of NON RECURRENT INGUINAL HERNIA  The various methods of treatment have been discussed with the patient and family. After consideration of risks, benefits and other options for treatment, the patient has consented to  Procedure(s): HERNIA REPAIR INGUINAL ADULT (Right) as a surgical intervention .  The patient's history has been reviewed, patient examined, no change in status, stable for surgery.  I have reviewed the patient's chart and labs. Right side marked in the pre procedure room.  Questions were answered to the patient's satisfaction.     Carolan ShiverEdgardo Cintron-Diaz

## 2017-08-20 NOTE — Progress Notes (Signed)
Patient encouraged to call for assistance to the BR, d/t history of 2 falls. Voiced understanding. Windy Carinaurner,Anne Boltz K, RN 11:07 PM 08/20/2017

## 2017-08-20 NOTE — Anesthesia Procedure Notes (Signed)
Procedure Name: LMA Insertion Performed by: Vania ReaLouw, Jared P, RN Pre-anesthesia Checklist: Patient identified, Patient being monitored, Timeout performed, Emergency Drugs available and Suction available Patient Re-evaluated:Patient Re-evaluated prior to induction Oxygen Delivery Method: Circle system utilized Preoxygenation: Pre-oxygenation with 100% oxygen Induction Type: IV induction LMA: LMA inserted LMA Size: 5.0 Tube type: Oral Number of attempts: 1 Placement Confirmation: positive ETCO2 and breath sounds checked- equal and bilateral Tube secured with: Tape Dental Injury: Teeth and Oropharynx as per pre-operative assessment

## 2017-08-21 DIAGNOSIS — K4090 Unilateral inguinal hernia, without obstruction or gangrene, not specified as recurrent: Secondary | ICD-10-CM | POA: Diagnosis not present

## 2017-08-21 DIAGNOSIS — Z7901 Long term (current) use of anticoagulants: Secondary | ICD-10-CM | POA: Diagnosis not present

## 2017-08-21 DIAGNOSIS — Z87891 Personal history of nicotine dependence: Secondary | ICD-10-CM | POA: Diagnosis not present

## 2017-08-21 DIAGNOSIS — Z7984 Long term (current) use of oral hypoglycemic drugs: Secondary | ICD-10-CM | POA: Diagnosis not present

## 2017-08-21 DIAGNOSIS — K219 Gastro-esophageal reflux disease without esophagitis: Secondary | ICD-10-CM | POA: Diagnosis not present

## 2017-08-21 DIAGNOSIS — I480 Paroxysmal atrial fibrillation: Secondary | ICD-10-CM | POA: Diagnosis not present

## 2017-08-21 DIAGNOSIS — Z79899 Other long term (current) drug therapy: Secondary | ICD-10-CM | POA: Diagnosis not present

## 2017-08-21 DIAGNOSIS — I10 Essential (primary) hypertension: Secondary | ICD-10-CM | POA: Diagnosis not present

## 2017-08-21 DIAGNOSIS — E119 Type 2 diabetes mellitus without complications: Secondary | ICD-10-CM | POA: Diagnosis not present

## 2017-08-21 MED ORDER — FAMOTIDINE 20 MG PO TABS: 20.0000 mg | ORAL_TABLET | Freq: Every day | ORAL | Status: DC

## 2017-08-21 MED ORDER — TRAMADOL HCL 50 MG PO TABS: 50.0000 mg | ORAL_TABLET | Freq: Four times a day (QID) | ORAL | 0 refills | Status: DC | PRN

## 2017-08-21 NOTE — Discharge Instructions (Signed)
°  Diet: Resume home heart healthy regular diet.   Activity: No heavy lifting > 10 pounds (children, pets, laundry, garbage) or strenuous activity until follow-up, but light activity and walking are encouraged. Do not drive or drink alcohol if taking narcotic pain medications.  Wound care: May shower with soapy water and pat dry (do not rub incisions), but no baths or submerging incision underwater until follow-up. (no swimming)   Medications: Resume all home medications except Eliquis. May restart Eliquis on Wednesday 08/22/17. For mild to moderate pain: acetaminophen (Tylenol) or ibuprofen (if no kidney disease). Combining Tylenol with alcohol can substantially increase your risk of causing liver disease. Narcotic pain medications, if prescribed, can be used for severe pain, though may cause nausea, constipation, and drowsiness. If you do not need the narcotic pain medication, you do not need to fill the prescription.  Call office [phone]) at any time if any questions, worsening pain, fevers/chills, bleeding, drainage from incision site, or other concerns.

## 2017-08-21 NOTE — Progress Notes (Signed)
PHARMACIST - PHYSICIAN COMMUNICATION  DR:  Hazle Quantintron-Diaz  CONCERNING: IV to Oral Route Change Policy  RECOMMENDATION: This patient is receiving famotidine by the intravenous route.  Based on criteria approved by the Pharmacy and Therapeutics Committee, the intravenous medication(s) is/are being converted to the equivalent oral dose form(s).   DESCRIPTION: These criteria include:  The patient is eating (either orally or via tube) and/or has been taking other orally administered medications for a least 24 hours  The patient has no evidence of active gastrointestinal bleeding or impaired GI absorption (gastrectomy, short bowel, patient on TNA or NPO).  If you have questions about this conversion, please contact the Pharmacy Department  []   [phone]( [phone] )  [facility] [x]   [phone]( [phone] )  [facility] []   [phone]( [phone] )  [facility] []   [phone]( [phone] )  [facility] []   [phone]( [phone] )  [facility]   Burnis Medinodney Laval Cafaro, [place] 08/21/2017 9:00 AM

## 2017-08-21 NOTE — Discharge Summary (Signed)
Physician Discharge Summary  Patient ID: Harold Pennington MRN: [id number] DOB/AGE: [birth year] 77 y.o.  Admit date: 08/20/2017 Discharge date: 08/21/2017  Admission Diagnoses: Right inguinal hernia  Discharge Diagnoses:  Active Problems:   Right inguinal hernia   Discharged Condition: good  Hospital Course: Patient admitted for observation after right inguinal hernia because the patient did not have any one to be with him within the first 24 hours after surgery and patient high risk for bleeding due to chronic anticoagulation. Patient tolerated the procedure well. Pain controlled, no bruises or hematoma.   Consults: None  Significant Diagnostic Studies: None  Treatments: analgesia: Tramadol and surgery: Right inguinal hernia repair with mesh  Discharge Exam: Blood pressure 112/67, pulse 63, temperature 98.6 F (37 C), temperature source Oral, resp. rate 20, height 5\' 9"  (1.753 m), weight 69.6 kg (153 lb 8 oz), SpO2 98 %. General appearance: alert and cooperative Resp: clear to auscultation bilaterally Cardio: regular rate and rhythm, S1, S2 normal, no murmur, click, rub or gallop GI: soft, non-tender; bowel sounds normal; no masses,  no organomegaly Incision/Wound:dry and clean, no ecchymosis or hematoma  Disposition: Discharge disposition: 01-Home or Self Care       Discharge Instructions    Diet - low sodium heart healthy   Complete by:  As directed      Allergies as of 08/21/2017      Reactions   Xarelto [rivaroxaban] Dermatitis   Blisters on hands    Codeine Nausea And Vomiting   lethargy       Medication List    TAKE these medications   acetaminophen 650 MG CR tablet Commonly known as:  TYLENOL Take 650 mg by mouth 2 (two) times daily as needed for pain.   amiodarone 200 MG tablet Commonly known as:  PACERONE Take 1 tablet (200 mg total) by mouth daily.   CENTRUM SILVER PO Take 1 tablet by mouth daily.   dicyclomine 10 MG capsule Commonly known as:   BENTYL Take 10 mg by mouth daily.   ELIQUIS 5 MG Tabs tablet Generic drug:  apixaban Take 5 mg by mouth 2 (two) times daily.   losartan 50 MG tablet Commonly known as:  COZAAR Take 50 mg by mouth 2 (two) times daily.   metFORMIN 500 MG tablet Commonly known as:  GLUCOPHAGE Take 1,000 mg by mouth 2 (two) times daily.   metoprolol succinate 50 MG 24 hr tablet Commonly known as:  TOPROL-XL Take 50 mg by mouth daily. Take with or immediately following a meal.   PHILLIPS COLON HEALTH Caps Take 1 capsule by mouth daily.   rosuvastatin 10 MG tablet Commonly known as:  CRESTOR Take 10 mg by mouth daily.   traMADol 50 MG tablet Commonly known as:  ULTRAM Take 1 tablet (50 mg total) by mouth every 6 (six) hours as needed (mild pain).   trolamine salicylate 10 % cream Commonly known as:  ASPERCREME Apply 1 application topically as needed for muscle pain.        Signed: Carolan Shiverdgardo Cintron-Diaz 08/21/2017, 9:14 AM

## 2017-08-21 NOTE — Care Management Obs Status (Signed)
MEDICARE OBSERVATION STATUS NOTIFICATION   Patient Details  Name: Harold Pennington MRN: [id number] Date of Birth: Apr 23, 1940   Medicare Observation Status Notification Given:  No(admitted obs less than 24 hours)    Chapman FitchBOWEN, Shelena Castelluccio T, RN 08/21/2017, 11:02 AM

## 2017-08-22 LAB — SURGICAL PATHOLOGY

## 2017-08-23 ENCOUNTER — Observation Stay
Admission: RE | Admit: 2017-08-23 | Discharge: 2017-08-24 | Disposition: A | Discharge status: Home / Self Care | Payer: Medicare Other | Source: Ambulatory Visit | Attending: General Surgery | Admitting: General Surgery

## 2017-08-23 ENCOUNTER — Ambulatory Visit: Payer: Self-pay | Admitting: General Surgery

## 2017-08-23 ENCOUNTER — Encounter: Payer: Self-pay | Admitting: *Deleted

## 2017-08-23 ENCOUNTER — Other Ambulatory Visit: Payer: Self-pay

## 2017-08-23 ENCOUNTER — Encounter
Admission: RE | Disposition: A | Discharge status: Home / Self Care | Payer: Self-pay | Source: Ambulatory Visit | Attending: General Surgery

## 2017-08-23 ENCOUNTER — Encounter: Payer: Self-pay | Admitting: Anesthesiology

## 2017-08-23 DIAGNOSIS — Z87891 Personal history of nicotine dependence: Secondary | ICD-10-CM | POA: Insufficient documentation

## 2017-08-23 DIAGNOSIS — Z7984 Long term (current) use of oral hypoglycemic drugs: Secondary | ICD-10-CM | POA: Insufficient documentation

## 2017-08-23 DIAGNOSIS — Y838 Other surgical procedures as the cause of abnormal reaction of the patient, or of later complication, without mention of misadventure at the time of the procedure: Secondary | ICD-10-CM | POA: Diagnosis not present

## 2017-08-23 DIAGNOSIS — I4891 Unspecified atrial fibrillation: Secondary | ICD-10-CM | POA: Insufficient documentation

## 2017-08-23 DIAGNOSIS — Z885 Allergy status to narcotic agent status: Secondary | ICD-10-CM | POA: Insufficient documentation

## 2017-08-23 DIAGNOSIS — M96840 Postprocedural hematoma of a musculoskeletal structure following a musculoskeletal system procedure: Secondary | ICD-10-CM | POA: Diagnosis not present

## 2017-08-23 DIAGNOSIS — G8918 Other acute postprocedural pain: Secondary | ICD-10-CM | POA: Diagnosis not present

## 2017-08-23 DIAGNOSIS — E119 Type 2 diabetes mellitus without complications: Secondary | ICD-10-CM | POA: Diagnosis not present

## 2017-08-23 DIAGNOSIS — Z7902 Long term (current) use of antithrombotics/antiplatelets: Secondary | ICD-10-CM | POA: Diagnosis not present

## 2017-08-23 DIAGNOSIS — I10 Essential (primary) hypertension: Secondary | ICD-10-CM | POA: Insufficient documentation

## 2017-08-23 DIAGNOSIS — Z79899 Other long term (current) drug therapy: Secondary | ICD-10-CM | POA: Diagnosis not present

## 2017-08-23 DIAGNOSIS — L7632 Postprocedural hematoma of skin and subcutaneous tissue following other procedure: Secondary | ICD-10-CM | POA: Diagnosis not present

## 2017-08-23 DIAGNOSIS — T148XXA Other injury of unspecified body region, initial encounter: Secondary | ICD-10-CM | POA: Diagnosis present

## 2017-08-23 HISTORY — PX: IRRIGATION AND DEBRIDEMENT HEMATOMA: SHX5254

## 2017-08-23 LAB — GLUCOSE, CAPILLARY
GLUCOSE-CAPILLARY: 133 mg/dL — AB (ref 70–99)
GLUCOSE-CAPILLARY: 113 mg/dL — AB (ref 70–99)

## 2017-08-23 SURGERY — IRRIGATION AND DEBRIDEMENT HEMATOMA
Anesthesia: Choice | Site: Groin | Laterality: Right | Wound class: "Clean "

## 2017-08-23 MED ORDER — BUPIVACAINE LIPOSOME 1.3 % IJ SUSP
INTRAMUSCULAR | Status: AC
  Filled 2017-08-23: qty 20

## 2017-08-23 MED ORDER — CEFAZOLIN SODIUM-DEXTROSE 2-4 GM/100ML-% IV SOLN
INTRAVENOUS | Status: AC
  Filled 2017-08-23: qty 100

## 2017-08-23 MED ORDER — BUPIVACAINE-EPINEPHRINE (PF) 0.5% -1:200000 IJ SOLN
INTRAMUSCULAR | Status: AC
  Filled 2017-08-23: qty 30

## 2017-08-23 MED ORDER — METOPROLOL SUCCINATE ER 50 MG PO TB24
50.0000 mg | ORAL_TABLET | Freq: Every day | ORAL | Status: DC
  Filled 2017-08-23: qty 1

## 2017-08-23 MED ORDER — GABAPENTIN 300 MG PO CAPS
300.0000 mg | ORAL_CAPSULE | Freq: Two times a day (BID) | ORAL | Status: DC
  Administered 2017-08-23 – 2017-08-24 (×2): 300 mg via ORAL
  Filled 2017-08-23 (×2): qty 1

## 2017-08-23 MED ORDER — METFORMIN HCL 500 MG PO TABS
1000.0000 mg | ORAL_TABLET | Freq: Two times a day (BID) | ORAL | Status: DC
  Administered 2017-08-23 – 2017-08-24 (×2): 1000 mg via ORAL
  Filled 2017-08-23 (×2): qty 2

## 2017-08-23 MED ORDER — BUPIVACAINE-EPINEPHRINE (PF) 0.5% -1:200000 IJ SOLN
INTRAMUSCULAR | Status: DC | PRN
  Administered 2017-08-23: 30 mL

## 2017-08-23 MED ORDER — BUPIVACAINE LIPOSOME 1.3 % IJ SUSP
INTRAMUSCULAR | Status: DC | PRN
  Administered 2017-08-23: 20 mL

## 2017-08-23 MED ORDER — ACETAMINOPHEN 325 MG PO TABS: 650.0000 mg | ORAL_TABLET | Freq: Two times a day (BID) | ORAL | Status: DC | PRN

## 2017-08-23 MED ORDER — VANCOMYCIN HCL 1000 MG IV SOLR
INTRAVENOUS | Status: AC
  Filled 2017-08-23: qty 1000

## 2017-08-23 MED ORDER — ROSUVASTATIN CALCIUM 10 MG PO TABS
10.0000 mg | ORAL_TABLET | Freq: Every evening | ORAL | Status: DC
  Administered 2017-08-23: 10 mg via ORAL
  Filled 2017-08-23: qty 1

## 2017-08-23 MED ORDER — LOSARTAN POTASSIUM 50 MG PO TABS
50.0000 mg | ORAL_TABLET | Freq: Two times a day (BID) | ORAL | Status: DC
  Administered 2017-08-23 – 2017-08-24 (×2): 50 mg via ORAL
  Filled 2017-08-23 (×2): qty 1

## 2017-08-23 MED ORDER — DICYCLOMINE HCL 10 MG PO CAPS
10.0000 mg | ORAL_CAPSULE | Freq: Every day | ORAL | Status: DC
  Administered 2017-08-24: 10 mg via ORAL
  Filled 2017-08-23: qty 1

## 2017-08-23 MED ORDER — ADULT MULTIVITAMIN W/MINERALS CH
ORAL_TABLET | Freq: Every day | ORAL | Status: DC
  Administered 2017-08-23 – 2017-08-24 (×2): 1 via ORAL
  Filled 2017-08-23 (×2): qty 1

## 2017-08-23 MED ORDER — VANCOMYCIN HCL 1000 MG IV SOLR
INTRAVENOUS | Status: DC | PRN
  Administered 2017-08-23: 1000 mg

## 2017-08-23 MED ORDER — TRAMADOL HCL 50 MG PO TABS
50.0000 mg | ORAL_TABLET | Freq: Four times a day (QID) | ORAL | Status: DC | PRN
  Administered 2017-08-24: 50 mg via ORAL
  Filled 2017-08-23: qty 1

## 2017-08-23 MED ORDER — AMIODARONE HCL 200 MG PO TABS
200.0000 mg | ORAL_TABLET | Freq: Every day | ORAL | Status: DC
  Administered 2017-08-24: 200 mg via ORAL
  Filled 2017-08-23: qty 1

## 2017-08-23 MED ORDER — CEFAZOLIN SODIUM-DEXTROSE 2-4 GM/100ML-% IV SOLN: 2.0000 g | INTRAVENOUS | Status: DC

## 2017-08-23 SURGICAL SUPPLY — 26 items
BLADE CLIPPER SURG (BLADE) ×3 IMPLANT
BLADE SURG 15 STRL LF DISP TIS (BLADE) ×1 IMPLANT
BLADE SURG 15 STRL SS (BLADE) ×2
BRUSH SCRUB EZ  4% CHG (MISCELLANEOUS)
BRUSH SCRUB EZ 4% CHG (MISCELLANEOUS) ×1 IMPLANT
CANISTER SUCT 3000ML PPV (MISCELLANEOUS) ×3 IMPLANT
DRAPE CHEST BREAST 77X106 FENE (MISCELLANEOUS) IMPLANT
DRAPE LAPAROTOMY 77X122 PED (DRAPES) ×3 IMPLANT
ELECT REM PT RETURN 9FT ADLT (ELECTROSURGICAL) ×3
ELECTRODE REM PT RTRN 9FT ADLT (ELECTROSURGICAL) ×1 IMPLANT
GAUZE SPONGE 4X4 12PLY STRL (GAUZE/BANDAGES/DRESSINGS) ×2 IMPLANT
GLOVE BIO SURGEON STRL SZ 6.5 (GLOVE) ×5 IMPLANT
GLOVE BIO SURGEONS STRL SZ 6.5 (GLOVE) ×4
GLOVE BIOGEL PI IND STRL 6.5 (GLOVE) ×1 IMPLANT
GLOVE BIOGEL PI INDICATOR 6.5 (GLOVE) ×8
GOWN STRL REUS W/ TWL LRG LVL3 (GOWN DISPOSABLE) ×2 IMPLANT
GOWN STRL REUS W/TWL LRG LVL3 (GOWN DISPOSABLE) ×4
NEEDLE HYPO 22GX1.5 SAFETY (NEEDLE) ×5 IMPLANT
NS IRRIG 1000ML POUR BTL (IV SOLUTION) ×5 IMPLANT
PACK BASIN MINOR ARMC (MISCELLANEOUS) ×3 IMPLANT
SCRUB POVIDONE IODINE 4 OZ (MISCELLANEOUS) ×1 IMPLANT
SOL PREP PVP 2OZ (MISCELLANEOUS)
SOLUTION PREP PVP 2OZ (MISCELLANEOUS) ×1 IMPLANT
SPONGE LAP 18X18 RF (DISPOSABLE) ×3 IMPLANT
SUT PROLENE 3 0 PS 2 (SUTURE) ×4 IMPLANT
SYR BULB IRRIG 60ML STRL (SYRINGE) ×2 IMPLANT

## 2017-08-23 NOTE — H&P (Signed)
  SURGICAL CONSULTATION NOTE   HISTORY OF PRESENT ILLNESS (HPI):  77 y.o. male presented to the office due to post op pain. Patient present with large bulging on the right inguinal area after inguinal hernia repair with mesh. Refer pain radiates to the right scrotal area. Patient currently on anticoagulation due to Afib.    PAST MEDICAL HISTORY (PMH):  Past Medical History:  Diagnosis Date  . Abdominal pain 07/2017   doubles over in pain but not sure of etiology  . Afib (HCC)   . Arthritis   . Diabetes mellitus without complication (HCC)   . Hypertension      PAST SURGICAL HISTORY (PSH):  Past Surgical History:  Procedure Laterality Date  . CARDIOVERSION N/A 09/20/2016   Procedure: CARDIOVERSION;  Surgeon: Kowalski, Bruce J, MD;  Location: [facility] ORS;  Service: Cardiovascular;  Laterality: N/A;  . INGUINAL HERNIA REPAIR Right 08/20/2017   Procedure: HERNIA REPAIR INGUINAL ADULT;  Surgeon: Cintron-Diaz, Ariston Grandison, MD;  Location: [facility] ORS;  Service: General;  Laterality: Right;     MEDICATIONS:  Prior to Admission medications   Medication Sig Start Date End Date Taking? Authorizing Provider  acetaminophen (TYLENOL) 650 MG CR tablet Take 650 mg by mouth 2 (two) times daily as needed for pain.    [provider]  amiodarone (PACERONE) 200 MG tablet Take 1 tablet (200 mg total) by mouth daily. Patient not taking: Reported on 08/14/2017 09/20/16   Kowalski, Bruce J, MD  apixaban (ELIQUIS) 5 MG TABS tablet Take 5 mg by mouth 2 (two) times daily.    [provider]  dicyclomine (BENTYL) 10 MG capsule Take 10 mg by mouth daily.     [provider]  losartan (COZAAR) 50 MG tablet Take 50 mg by mouth 2 (two) times daily.    [provider]  metFORMIN (GLUCOPHAGE) 500 MG tablet Take 1,000 mg by mouth 2 (two) times daily.    [provider]  metoprolol succinate (TOPROL-XL) 50 MG 24 hr tablet Take 50 mg by mouth daily. Take with or immediately following a  meal.     [provider]  Multiple Vitamins-Minerals (CENTRUM SILVER PO) Take 1 tablet by mouth daily.    [provider]  Probiotic Product (PHILLIPS COLON HEALTH) CAPS Take 1 capsule by mouth daily.    [provider]  rosuvastatin (CRESTOR) 10 MG tablet Take 10 mg by mouth daily. 07/09/17 07/09/18  [provider]  traMADol (ULTRAM) 50 MG tablet Take 1 tablet (50 mg total) by mouth every 6 (six) hours as needed (mild pain). 08/21/17   Cintron-Diaz, Kyland No, MD  trolamine salicylate (ASPERCREME) 10 % cream Apply 1 application topically as needed for muscle pain.    [provider]     ALLERGIES:  Allergies  Allergen Reactions  . Xarelto [Rivaroxaban] Dermatitis    Blisters on hands   . Codeine Nausea And Vomiting    lethargy      SOCIAL HISTORY:  Social History   Socioeconomic History  . Marital status: Widowed    Spouse name: Not on file  . Number of children: Not on file  . Years of education: Not on file  . Highest education level: Not on file  Occupational History  . Not on file  Social Needs  . Financial resource strain: Not on file  . Food insecurity:    Worry: Not on file    Inability: Not on file  . Transportation needs:    Medical: Not on   file    Non-medical: Not on file  Tobacco Use  . Smoking status: Former Smoker    Packs/day: 0.50    Types: Cigarettes    Last attempt to quit: 1980    Years since quitting: 39.6  . Smokeless tobacco: Never Used  Substance and Sexual Activity  . Alcohol use: No  . Drug use: No  . Sexual activity: Not on file  Lifestyle  . Physical activity:    Days per week: Not on file    Minutes per session: Not on file  . Stress: Not on file  Relationships  . Social connections:    Talks on phone: Not on file    Gets together: Not on file    Attends religious service: Not on file    Active member of club or organization: Not on file    Attends meetings of clubs or organizations: Not on  file    Relationship status: Not on file  . Intimate partner violence:    Fear of current or ex partner: Not on file    Emotionally abused: Not on file    Physically abused: Not on file    Forced sexual activity: Not on file  Other Topics Concern  . Not on file  Social History Narrative  . Not on file     FAMILY HISTORY:  No family history on file.   REVIEW OF SYSTEMS:  Constitutional: denies weight loss, fever, chills, or sweats  Eyes: denies any other vision changes, history of eye injury  ENT: denies sore throat, hearing problems  Respiratory: denies shortness of breath, wheezing  Cardiovascular: denies chest pain, palpitations  Gastrointestinal: denies abdominal pain, N/V, or diarrhea Genitourinary: denies burning with urination or urinary frequency Musculoskeletal: denies any other joint pains or cramps  Skin: denies any other rashes or skin discolorations  Neurological: denies any other headache, dizziness, weakness  Psychiatric: denies any other depression, anxiety   All other review of systems were negative   VITAL SIGNS:  @VSRANGES@             INTAKE/OUTPUT:  This shift: @IOTHISSHIFT@  Last 2 shifts: @IOLAST2SHIFTS@   PHYSICAL EXAM:  Constitutional:  -- Normal body habitus  -- Awake, alert, and oriented x3  Eyes:  -- Pupils equally round and reactive to light  -- No scleral icterus  Ear, nose, and throat:  -- No jugular venous distension  Pulmonary:  -- No crackles  -- Equal breath sounds bilaterally -- Breathing non-labored at rest Cardiovascular:  -- S1, S2 present  -- No pericardial rubs Gastrointestinal:  -- Abdomen soft, nontender, non-distended, no guarding or rebound tenderness -- No abdominal masses appreciated, pulsatile or otherwise  -- Right inguinal hematoma, with right testicle bruise. Soft, minimal tender.  Musculoskeletal and Integumentary:  -- Wounds or skin discoloration: None appreciated -- Extremities: B/L UE and LE FROM, hands  and feet warm, no edema  Neurologic:  -- Motor function: intact and symmetric -- Sensation: intact and symmetric   Labs:  CBC Latest Ref Rng & Units 10/14/2013  WBC 4.6 - 10.2 K/uL 7.0  Hemoglobin 14.1 - 18.1 g/dL 14.5  Hematocrit 43.5 - 53.7 % 45.7   CMP Latest Ref Rng & Units 09/20/2016  Glucose 65 - 99 mg/dL 109(H)  BUN 6 - 20 mg/dL 24(H)  Creatinine 0.61 - 1.24 mg/dL 1.15  Sodium 135 - 145 mmol/L 138  Potassium 3.5 - 5.1 mmol/L 4.0  Chloride 101 - 111 mmol/L 104  CO2 22 - 32   mmol/L 24  Calcium 8.9 - 10.3 mg/dL 9.5    Assessment/Plan:  77 y.o. male with post operative right inguinal hematoma due to ancicoagulation, complicated by pertinent comorbidities including afib, diabetes and hypertension.  Patient oriented about finding and about the recommendation of evacuation of hematoma in the OR for adequate evacuation. Leaving an hematoma increase the risk on infection in an area close to the mesh.   Patient agreed to proceed with the surgery.   Leanna Hamid Cintrn-Daz, MD  

## 2017-08-23 NOTE — Op Note (Signed)
Preoperative diagnosis: Right inguinal wound hematoma  Postoperative diagnosis: Right inguinal wound hematoma  Procedure: Evacuation of right inguinal wound hematoma  Anesthesia: Local  Surgeon: Dr. Hazle Quantintron Diaz  Wound Classification: Clean  Indications:  Patient is a 77 y.o. male developed had right inguinal hernia repair with mesh 3 days ago. Patient on anticoagulation due to afib and started Eliquis yesterday. Today with large hematoma. Evacuation needed to be done to avoid infection and improve healing.   Findings: 1. Large amount of blood clots drained 2. No active bleeding 3. External oblique fascia intact  Description of procedure: The patient was taken to the operating room. A time-out was completed verifying correct patient, procedure, site, positioning, and implant(s) and/or special equipment prior to beginning this procedure. Local anesthesia was infiltrated. The right groin was prepped and draped in the usual sterile fashion.  Previous incision was opened with #15 blade. The vicryl sutures were cut with scissors. The blood clots were identified and evacuated. The inguinal area was irrigated with a Vancomycin 1 gm diluted in saline solution. Thorough irrigation was done until the area was clean from clot. After complete evacuation of hematoma, the area was inspected and no bleeding was found. The regional inguinal block was done with Exparel. The wound was closed with multiple interrupted mattress Prolene sutures. Sterile guazes were left as dressing.  The patient tolerated the procedure well and was taken to the postanesthesia care unit in stable condition.  Specimen: None    Complications: None  Estimated Blood Loss: 5 mL

## 2017-08-23 NOTE — H&P (View-Only) (Signed)
SURGICAL CONSULTATION NOTE   HISTORY OF PRESENT ILLNESS (HPI):  77 y.o. male presented to the office due to post op pain. Patient present with large bulging on the right inguinal area after inguinal hernia repair with mesh. Refer pain radiates to the right scrotal area. Patient currently on anticoagulation due to Afib.    PAST MEDICAL HISTORY (PMH):  Past Medical History:  Diagnosis Date  . Abdominal pain 07/2017   doubles over in pain but not sure of etiology  . Afib (HCC)   . Arthritis   . Diabetes mellitus without complication (HCC)   . Hypertension      PAST SURGICAL HISTORY (PSH):  Past Surgical History:  Procedure Laterality Date  . CARDIOVERSION N/A 09/20/2016   Procedure: CARDIOVERSION;  Surgeon: Lamar Blinks, MD;  Location: [facility] ORS;  Service: Cardiovascular;  Laterality: N/A;  . INGUINAL HERNIA REPAIR Right 08/20/2017   Procedure: HERNIA REPAIR INGUINAL ADULT;  Surgeon: Carolan Shiver, MD;  Location: [facility] ORS;  Service: General;  Laterality: Right;     MEDICATIONS:  Prior to Admission medications   Medication Sig Start Date End Date Taking? Authorizing Provider  acetaminophen (TYLENOL) 650 MG CR tablet Take 650 mg by mouth 2 (two) times daily as needed for pain.    [provider]  amiodarone (PACERONE) 200 MG tablet Take 1 tablet (200 mg total) by mouth daily. Patient not taking: Reported on 08/14/2017 09/20/16   Lamar Blinks, MD  apixaban (ELIQUIS) 5 MG TABS tablet Take 5 mg by mouth 2 (two) times daily.    [provider]  dicyclomine (BENTYL) 10 MG capsule Take 10 mg by mouth daily.     [provider]  losartan (COZAAR) 50 MG tablet Take 50 mg by mouth 2 (two) times daily.    [provider]  metFORMIN (GLUCOPHAGE) 500 MG tablet Take 1,000 mg by mouth 2 (two) times daily.    [provider]  metoprolol succinate (TOPROL-XL) 50 MG 24 hr tablet Take 50 mg by mouth daily. Take with or immediately following a  meal.     [provider]  Multiple Vitamins-Minerals (CENTRUM SILVER PO) Take 1 tablet by mouth daily.    [provider]  Probiotic Product (PHILLIPS COLON HEALTH) CAPS Take 1 capsule by mouth daily.    [provider]  rosuvastatin (CRESTOR) 10 MG tablet Take 10 mg by mouth daily. 07/09/17 07/09/18  [provider]  traMADol (ULTRAM) 50 MG tablet Take 1 tablet (50 mg total) by mouth every 6 (six) hours as needed (mild pain). 08/21/17   Carolan Shiver, MD  trolamine salicylate (ASPERCREME) 10 % cream Apply 1 application topically as needed for muscle pain.    [provider]     ALLERGIES:  Allergies  Allergen Reactions  . Xarelto [Rivaroxaban] Dermatitis    Blisters on hands   . Codeine Nausea And Vomiting    lethargy      SOCIAL HISTORY:  Social History   Socioeconomic History  . Marital status: Widowed    Spouse name: Not on file  . Number of children: Not on file  . Years of education: Not on file  . Highest education level: Not on file  Occupational History  . Not on file  Social Needs  . Financial resource strain: Not on file  . Food insecurity:    Worry: Not on file    Inability: Not on file  . Transportation needs:    Medical: Not on  file    Non-medical: Not on file  Tobacco Use  . Smoking status: Former Smoker    Packs/day: 0.50    Types: Cigarettes    Last attempt to quit: 1980    Years since quitting: 39.6  . Smokeless tobacco: Never Used  Substance and Sexual Activity  . Alcohol use: No  . Drug use: No  . Sexual activity: Not on file  Lifestyle  . Physical activity:    Days per week: Not on file    Minutes per session: Not on file  . Stress: Not on file  Relationships  . Social connections:    Talks on phone: Not on file    Gets together: Not on file    Attends religious service: Not on file    Active member of club or organization: Not on file    Attends meetings of clubs or organizations: Not on  file    Relationship status: Not on file  . Intimate partner violence:    Fear of current or ex partner: Not on file    Emotionally abused: Not on file    Physically abused: Not on file    Forced sexual activity: Not on file  Other Topics Concern  . Not on file  Social History Narrative  . Not on file     FAMILY HISTORY:  No family history on file.   REVIEW OF SYSTEMS:  Constitutional: denies weight loss, fever, chills, or sweats  Eyes: denies any other vision changes, history of eye injury  ENT: denies sore throat, hearing problems  Respiratory: denies shortness of breath, wheezing  Cardiovascular: denies chest pain, palpitations  Gastrointestinal: denies abdominal pain, N/V, or diarrhea Genitourinary: denies burning with urination or urinary frequency Musculoskeletal: denies any other joint pains or cramps  Skin: denies any other rashes or skin discolorations  Neurological: denies any other headache, dizziness, weakness  Psychiatric: denies any other depression, anxiety   All other review of systems were negative   VITAL SIGNS:  @VSRANGES @             INTAKE/OUTPUT:  This shift: @IOTHISSHIFT @  Last 2 shifts: @IOLAST2SHIFTS @   PHYSICAL EXAM:  Constitutional:  -- Normal body habitus  -- Awake, alert, and oriented x3  Eyes:  -- Pupils equally round and reactive to light  -- No scleral icterus  Ear, nose, and throat:  -- No jugular venous distension  Pulmonary:  -- No crackles  -- Equal breath sounds bilaterally -- Breathing non-labored at rest Cardiovascular:  -- S1, S2 present  -- No pericardial rubs Gastrointestinal:  -- Abdomen soft, nontender, non-distended, no guarding or rebound tenderness -- No abdominal masses appreciated, pulsatile or otherwise  -- Right inguinal hematoma, with right testicle bruise. Soft, minimal tender.  Musculoskeletal and Integumentary:  -- Wounds or skin discoloration: None appreciated -- Extremities: B/L UE and LE FROM, hands  and feet warm, no edema  Neurologic:  -- Motor function: intact and symmetric -- Sensation: intact and symmetric   Labs:  CBC Latest Ref Rng & Units 10/14/2013  WBC 4.6 - 10.2 K/uL 7.0  Hemoglobin 14.1 - 18.1 g/dL 40.9  Hematocrit 81.1 - 53.7 % 45.7   CMP Latest Ref Rng & Units 09/20/2016  Glucose 65 - 99 mg/dL 914(N)  BUN 6 - 20 mg/dL 82(N)  Creatinine 5.62 - 1.24 mg/dL 1.30  Sodium 865 - 784 mmol/L 138  Potassium 3.5 - 5.1 mmol/L 4.0  Chloride 101 - 111 mmol/L 104  CO2 22 - 32  mmol/L 24  Calcium 8.9 - 10.3 mg/dL 9.5    Assessment/Plan:  77 y.o. male with post operative right inguinal hematoma due to ancicoagulation, complicated by pertinent comorbidities including afib, diabetes and hypertension.  Patient oriented about finding and about the recommendation of evacuation of hematoma in the OR for adequate evacuation. Leaving an hematoma increase the risk on infection in an area close to the mesh.   Patient agreed to proceed with the surgery.   Gae GallopEdgardo Cintrn-Daz, MD

## 2017-08-23 NOTE — Interval H&P Note (Signed)
History and Physical Interval Note:  08/23/2017 5:11 PM  Harold Pennington  has presented today for surgery, with the diagnosis of hematoma  The various methods of treatment have been discussed with the patient and family. After consideration of risks, benefits and other options for treatment, the patient has consented to  Procedure(s): IRRIGATION AND DEBRIDEMENT HEMATOMA (right inguinal hernia sight) (Right) as a surgical intervention .  The patient's history has been reviewed, patient examined, no change in status, stable for surgery.  I have reviewed the patient's chart and labs.  Questions were answered to the patient's satisfaction.     Carolan ShiverEdgardo Cintron-Diaz

## 2017-08-24 DIAGNOSIS — E119 Type 2 diabetes mellitus without complications: Secondary | ICD-10-CM | POA: Diagnosis not present

## 2017-08-24 DIAGNOSIS — I10 Essential (primary) hypertension: Secondary | ICD-10-CM | POA: Diagnosis not present

## 2017-08-24 DIAGNOSIS — Z885 Allergy status to narcotic agent status: Secondary | ICD-10-CM | POA: Diagnosis not present

## 2017-08-24 DIAGNOSIS — Z7902 Long term (current) use of antithrombotics/antiplatelets: Secondary | ICD-10-CM | POA: Diagnosis not present

## 2017-08-24 DIAGNOSIS — I4891 Unspecified atrial fibrillation: Secondary | ICD-10-CM | POA: Diagnosis not present

## 2017-08-24 DIAGNOSIS — Z7984 Long term (current) use of oral hypoglycemic drugs: Secondary | ICD-10-CM | POA: Diagnosis not present

## 2017-08-24 DIAGNOSIS — G8918 Other acute postprocedural pain: Secondary | ICD-10-CM | POA: Diagnosis not present

## 2017-08-24 DIAGNOSIS — M96840 Postprocedural hematoma of a musculoskeletal structure following a musculoskeletal system procedure: Secondary | ICD-10-CM | POA: Diagnosis not present

## 2017-08-24 DIAGNOSIS — Z87891 Personal history of nicotine dependence: Secondary | ICD-10-CM | POA: Diagnosis not present

## 2017-08-24 DIAGNOSIS — Z79899 Other long term (current) drug therapy: Secondary | ICD-10-CM | POA: Diagnosis not present

## 2017-08-24 LAB — CBC WITH DIFFERENTIAL/PLATELET
Basophils Absolute: 0 10*3/uL (ref 0–0.1)
Basophils Relative: 0 %
EOS ABS: 0.1 10*3/uL (ref 0–0.7)
Eosinophils Relative: 1 %
HEMATOCRIT: 34.9 % — AB (ref 40.0–52.0)
Hemoglobin: 12.4 g/dL — ABNORMAL LOW (ref 13.0–18.0)
LYMPHS ABS: 1.1 10*3/uL (ref 1.0–3.6)
Lymphocytes Relative: 15 %
MCH: 32.8 pg (ref 26.0–34.0)
MCHC: 35.6 g/dL (ref 32.0–36.0)
MCV: 92.3 fL (ref 80.0–100.0)
MONOS PCT: 10 %
Monocytes Absolute: 0.8 10*3/uL (ref 0.2–1.0)
NEUTROS PCT: 74 %
Neutro Abs: 5.6 10*3/uL (ref 1.4–6.5)
Platelets: 225 10*3/uL (ref 150–440)
RBC: 3.78 MIL/uL — ABNORMAL LOW (ref 4.40–5.90)
RDW: 13.7 % (ref 11.5–14.5)
WBC: 7.6 10*3/uL (ref 3.8–10.6)

## 2017-08-24 LAB — GLUCOSE, CAPILLARY: Glucose-Capillary: 151 mg/dL — ABNORMAL HIGH (ref 70–99)

## 2017-08-24 MED ORDER — DICLOFENAC SODIUM 50 MG PO TBEC: 50.0000 mg | DELAYED_RELEASE_TABLET | Freq: Two times a day (BID) | ORAL | 0 refills | Status: AC

## 2017-08-24 MED ORDER — GABAPENTIN 300 MG PO CAPS: 300.0000 mg | ORAL_CAPSULE | Freq: Two times a day (BID) | ORAL | 0 refills | Status: AC

## 2017-08-24 NOTE — Discharge Summary (Signed)
Physician Discharge Summary  Patient ID: Harold Pennington MRN: [id number] DOB/AGE: 76-Aug-1942 76 y.o.  Admit date: 08/23/2017 Discharge date: 08/24/2017  Admission Diagnoses: Post operative bleeding  Discharge Diagnoses:  Active Problems:   Bleeding from wound   Discharged Condition: good  Hospital Course: Patient admitted for evacuation of hematoma from a right inguinal hernia repair. Patient tolerated the procedure well. Today, no recurrence of hematoma. Pain controlled. No sign of active bleeding.   Consults: None  Significant Diagnostic Studies: None   Treatments: analgesia: Tramadol, Gabapentin and surgery: evacuation of hematoma.   Discharge Exam: Blood pressure 127/87, pulse (!) 56, temperature 98.4 F (36.9 C), temperature source Oral, resp. rate 18, height 5\' 8"  (1.727 m), weight 69.4 kg (153 lb), SpO2 99 %. General appearance: alert and cooperative Resp: clear to auscultation bilaterally GI: soft, non-tender; bowel sounds normal; no masses,  no organomegaly Incision/Wound: Wound with residual ecchymosis, no enlarging hematoma. Testicular ecchymosis but with no pain or swelling.   Disposition: Discharge disposition: 01-Home or Self Care       Discharge Instructions    Diet - low sodium heart healthy   Complete by:  As directed      Allergies as of 08/24/2017      Reactions   Xarelto [rivaroxaban] Dermatitis   Blisters on hands    Codeine Nausea And Vomiting   lethargy       Medication List    STOP taking these medications   traMADol 50 MG tablet Commonly known as:  ULTRAM     TAKE these medications   acetaminophen 650 MG CR tablet Commonly known as:  TYLENOL Take 650 mg by mouth 2 (two) times daily as needed for pain.   amiodarone 200 MG tablet Commonly known as:  PACERONE Take 1 tablet (200 mg total) by mouth daily.   CENTRUM SILVER PO Take 1 tablet by mouth daily.   diclofenac 50 MG EC tablet Commonly known as:  VOLTAREN Take 1 tablet (50 mg  total) by mouth 2 (two) times daily for 7 days.   dicyclomine 10 MG capsule Commonly known as:  BENTYL Take 10 mg by mouth daily.   ELIQUIS 5 MG Tabs tablet Generic drug:  apixaban Take 5 mg by mouth 2 (two) times daily.   gabapentin 300 MG capsule Commonly known as:  NEURONTIN Take 1 capsule (300 mg total) by mouth 2 (two) times daily for 7 days.   losartan 50 MG tablet Commonly known as:  COZAAR Take 50 mg by mouth 2 (two) times daily.   metFORMIN 500 MG tablet Commonly known as:  GLUCOPHAGE Take 1,000 mg by mouth 2 (two) times daily.   metoprolol succinate 50 MG 24 hr tablet Commonly known as:  TOPROL-XL Take 50 mg by mouth daily. Take with or immediately following a meal.   PHILLIPS COLON HEALTH Caps Take 1 capsule by mouth daily.   rosuvastatin 10 MG tablet Commonly known as:  CRESTOR Take 10 mg by mouth daily.   trolamine salicylate 10 % cream Commonly known as:  ASPERCREME Apply 1 application topically as needed for muscle pain.      Follow-up Information    Carolan Shiverintron-Diaz, Lawonda Pretlow, MD Follow up on 08/27/2017.   Specialty:  General Surgery Contact information: [address] [place] [address] [phone]           Signed: Carolan Shiverdgardo Cintron-Diaz 08/24/2017, 1:23 PM

## 2017-08-24 NOTE — Progress Notes (Addendum)
Patient discharge teaching given, including activity, diet, follow-up appoints, and medications. Patient verbalized understanding of all discharge instructions. IV access was d/c'd. Vitals are stable. Multiple dressing changes were given to pt and pt verbalizes understanding and demonstrated with the teach back method. Skin is intact except as charted in most recent assessments. Pt to be escorted out by volunteer, to be driven home by family.  Harold Pennington [place]

## 2017-08-24 NOTE — Care Management Obs Status (Signed)
MEDICARE OBSERVATION STATUS NOTIFICATION   Patient Details  Name: Harold Pennington MRN: [id number] Date of Birth: Apr 04, 1940   Medicare Observation Status Notification Given:  No(admitted obs less and 24 hours)    Chapman FitchBOWEN, Tamsyn Owusu T, RN 08/24/2017, 2:09 PM

## 2017-08-24 NOTE — Discharge Instructions (Signed)
°  Diet: Resume home heart healthy regular diet.   Activity: No heavy lifting (children, pets, laundry, garbage) or strenuous activity until follow-up, but light activity and walking are encouraged. Do not drive or drink alcohol if taking narcotic pain medications.  Wound care: Remove dressing every day, once dressing removed, may shower with soapy water and pat dry (do not rub incisions), but no baths or submerging incision underwater until follow-up. If there is no drainage does not has to put another dressing. I still draining, may put another gauze.   Medications: Resume all home medications Eliquis. May restart Eliquis on Monday. For mild to moderate pain: acetaminophen (Tylenol) or ibuprofen (if no kidney disease). Combining Tylenol with alcohol can substantially increase your risk of causing liver disease. New pain medications prescribed are not narcotic. Gabapentin may cause drowsiness.   Stop taking Tramadol   Call office [phone]) at any time if any questions, worsening pain, fevers/chills, bleeding, drainage from incision site, or other concerns.

## 2017-08-24 NOTE — Progress Notes (Signed)
Nutrition Brief Note  Patient identified on the Malnutrition Screening Tool (MST) Report  77 y.o. male s/p inguinal hernia repair with mesh 7/29; now with hematoma s/p I & D 8/1  Reviewed pt's chart. Pt eating 100% of meals while in hospital. Per chart, pt has been weight stable for the past 8 months. Noted, pt on GI soft diet; unsure if this was meant to be mechanical soft diet. Spoke to RN, pt does not have any trouble chewing or swallowing; would recommend regular diet.   Wt Readings from Last 15 Encounters:  08/23/17 153 lb (69.4 kg)  08/20/17 153 lb 8 oz (69.6 kg)  08/15/17 153 lb 8 oz (69.6 kg)  09/20/16 174 lb (78.9 kg)  10/14/13 216 lb 3.2 oz (98.1 kg)    Body mass index is 23.26 kg/m. Patient meets criteria for normal based on current BMI.   Current diet order is soft, patient is consuming approximately 100% of meals at this time. Labs and medications reviewed.   No nutrition interventions warranted at this time. If nutrition issues arise, please consult RD.   Betsey Holidayasey Crecencio Kwiatek MS, RD, LDN Pager #- [phone] Office#- [phone] After Hours Pager: [phone]

## 2017-08-27 ENCOUNTER — Encounter: Payer: Self-pay | Admitting: General Surgery

## 2017-08-31 DIAGNOSIS — L7634 Postprocedural seroma of skin and subcutaneous tissue following other procedure: Secondary | ICD-10-CM | POA: Insufficient documentation

## 2017-09-05 ENCOUNTER — Other Ambulatory Visit: Payer: Self-pay | Admitting: Internal Medicine

## 2017-09-05 DIAGNOSIS — E119 Type 2 diabetes mellitus without complications: Secondary | ICD-10-CM | POA: Diagnosis not present

## 2017-09-05 DIAGNOSIS — I482 Chronic atrial fibrillation: Secondary | ICD-10-CM | POA: Diagnosis not present

## 2017-09-05 DIAGNOSIS — R634 Abnormal weight loss: Secondary | ICD-10-CM

## 2017-09-05 DIAGNOSIS — E782 Mixed hyperlipidemia: Secondary | ICD-10-CM | POA: Diagnosis not present

## 2017-09-05 DIAGNOSIS — R109 Unspecified abdominal pain: Secondary | ICD-10-CM

## 2017-10-09 ENCOUNTER — Ambulatory Visit
Admission: RE | Admit: 2017-10-09 | Discharge: 2017-10-09 | Disposition: A | Discharge status: Home / Self Care | Payer: Medicare Other | Source: Ambulatory Visit | Attending: Internal Medicine | Admitting: Internal Medicine

## 2017-10-09 DIAGNOSIS — R109 Unspecified abdominal pain: Secondary | ICD-10-CM

## 2017-10-09 DIAGNOSIS — R634 Abnormal weight loss: Secondary | ICD-10-CM

## 2017-10-09 MED ORDER — IOPAMIDOL (ISOVUE-300) INJECTION 61%
100.0000 mL | Freq: Once | INTRAVENOUS | Status: AC | PRN
  Administered 2017-10-09: 100 mL via INTRAVENOUS

## 2017-10-12 ENCOUNTER — Other Ambulatory Visit: Payer: Self-pay | Admitting: General Surgery

## 2017-10-12 DIAGNOSIS — N62 Hypertrophy of breast: Secondary | ICD-10-CM

## 2017-10-18 ENCOUNTER — Ambulatory Visit
Admission: RE | Admit: 2017-10-18 | Discharge: 2017-10-18 | Disposition: A | Discharge status: Home / Self Care | Payer: Medicare Other | Source: Ambulatory Visit | Attending: General Surgery | Admitting: General Surgery

## 2017-10-18 DIAGNOSIS — N62 Hypertrophy of breast: Secondary | ICD-10-CM

## 2017-10-18 DIAGNOSIS — R928 Other abnormal and inconclusive findings on diagnostic imaging of breast: Secondary | ICD-10-CM | POA: Diagnosis not present

## 2017-12-10 DIAGNOSIS — Z0000 Encounter for general adult medical examination without abnormal findings: Secondary | ICD-10-CM | POA: Diagnosis not present

## 2017-12-10 DIAGNOSIS — E119 Type 2 diabetes mellitus without complications: Secondary | ICD-10-CM | POA: Diagnosis not present

## 2017-12-10 DIAGNOSIS — E782 Mixed hyperlipidemia: Secondary | ICD-10-CM | POA: Diagnosis not present

## 2017-12-10 DIAGNOSIS — I482 Chronic atrial fibrillation, unspecified: Secondary | ICD-10-CM | POA: Diagnosis not present

## 2017-12-17 DIAGNOSIS — E1122 Type 2 diabetes mellitus with diabetic chronic kidney disease: Secondary | ICD-10-CM | POA: Diagnosis not present

## 2017-12-17 DIAGNOSIS — N182 Chronic kidney disease, stage 2 (mild): Secondary | ICD-10-CM | POA: Diagnosis not present

## 2017-12-17 DIAGNOSIS — E1169 Type 2 diabetes mellitus with other specified complication: Secondary | ICD-10-CM | POA: Diagnosis not present

## 2017-12-17 DIAGNOSIS — I482 Chronic atrial fibrillation, unspecified: Secondary | ICD-10-CM | POA: Diagnosis not present

## 2017-12-17 DIAGNOSIS — Z0000 Encounter for general adult medical examination without abnormal findings: Secondary | ICD-10-CM | POA: Diagnosis not present

## 2018-01-07 DIAGNOSIS — I480 Paroxysmal atrial fibrillation: Secondary | ICD-10-CM | POA: Diagnosis not present

## 2018-01-07 DIAGNOSIS — E782 Mixed hyperlipidemia: Secondary | ICD-10-CM | POA: Diagnosis not present

## 2018-01-07 DIAGNOSIS — I10 Essential (primary) hypertension: Secondary | ICD-10-CM | POA: Diagnosis not present

## 2018-01-31 DIAGNOSIS — M170 Bilateral primary osteoarthritis of knee: Secondary | ICD-10-CM | POA: Diagnosis not present

## 2018-01-31 DIAGNOSIS — G8929 Other chronic pain: Secondary | ICD-10-CM | POA: Diagnosis not present

## 2018-01-31 DIAGNOSIS — M25562 Pain in left knee: Secondary | ICD-10-CM | POA: Diagnosis not present

## 2018-01-31 DIAGNOSIS — M25561 Pain in right knee: Secondary | ICD-10-CM | POA: Diagnosis not present

## 2018-02-06 DIAGNOSIS — M19072 Primary osteoarthritis, left ankle and foot: Secondary | ICD-10-CM | POA: Diagnosis not present

## 2018-02-06 DIAGNOSIS — M19071 Primary osteoarthritis, right ankle and foot: Secondary | ICD-10-CM | POA: Diagnosis not present

## 2018-02-06 DIAGNOSIS — M79671 Pain in right foot: Secondary | ICD-10-CM | POA: Diagnosis not present

## 2018-02-06 DIAGNOSIS — M79672 Pain in left foot: Secondary | ICD-10-CM | POA: Diagnosis not present

## 2018-02-19 DIAGNOSIS — M79674 Pain in right toe(s): Secondary | ICD-10-CM | POA: Diagnosis not present

## 2018-02-19 DIAGNOSIS — M79675 Pain in left toe(s): Secondary | ICD-10-CM | POA: Diagnosis not present

## 2018-02-19 DIAGNOSIS — E119 Type 2 diabetes mellitus without complications: Secondary | ICD-10-CM | POA: Diagnosis not present

## 2018-02-19 DIAGNOSIS — B351 Tinea unguium: Secondary | ICD-10-CM | POA: Diagnosis not present

## 2018-02-19 DIAGNOSIS — Q6689 Other  specified congenital deformities of feet: Secondary | ICD-10-CM | POA: Diagnosis not present

## 2018-04-29 DIAGNOSIS — I482 Chronic atrial fibrillation, unspecified: Secondary | ICD-10-CM | POA: Diagnosis not present

## 2018-04-29 DIAGNOSIS — E1169 Type 2 diabetes mellitus with other specified complication: Secondary | ICD-10-CM | POA: Diagnosis not present

## 2018-04-29 DIAGNOSIS — E1122 Type 2 diabetes mellitus with diabetic chronic kidney disease: Secondary | ICD-10-CM | POA: Diagnosis not present

## 2018-05-06 DIAGNOSIS — I129 Hypertensive chronic kidney disease with stage 1 through stage 4 chronic kidney disease, or unspecified chronic kidney disease: Secondary | ICD-10-CM | POA: Diagnosis not present

## 2018-05-06 DIAGNOSIS — N182 Chronic kidney disease, stage 2 (mild): Secondary | ICD-10-CM | POA: Diagnosis not present

## 2018-05-06 DIAGNOSIS — E1169 Type 2 diabetes mellitus with other specified complication: Secondary | ICD-10-CM | POA: Diagnosis not present

## 2018-05-06 DIAGNOSIS — Z23 Encounter for immunization: Secondary | ICD-10-CM | POA: Diagnosis not present

## 2018-05-06 DIAGNOSIS — E782 Mixed hyperlipidemia: Secondary | ICD-10-CM | POA: Diagnosis not present

## 2018-07-09 DIAGNOSIS — I480 Paroxysmal atrial fibrillation: Secondary | ICD-10-CM | POA: Diagnosis not present

## 2018-07-09 DIAGNOSIS — I10 Essential (primary) hypertension: Secondary | ICD-10-CM | POA: Diagnosis not present

## 2018-07-09 DIAGNOSIS — E782 Mixed hyperlipidemia: Secondary | ICD-10-CM | POA: Diagnosis not present

## 2018-08-07 DIAGNOSIS — H6123 Impacted cerumen, bilateral: Secondary | ICD-10-CM | POA: Diagnosis not present

## 2018-08-07 DIAGNOSIS — H938X3 Other specified disorders of ear, bilateral: Secondary | ICD-10-CM | POA: Diagnosis not present

## 2018-08-09 DIAGNOSIS — R42 Dizziness and giddiness: Secondary | ICD-10-CM | POA: Diagnosis not present

## 2018-09-16 DIAGNOSIS — I129 Hypertensive chronic kidney disease with stage 1 through stage 4 chronic kidney disease, or unspecified chronic kidney disease: Secondary | ICD-10-CM | POA: Diagnosis not present

## 2018-09-16 DIAGNOSIS — E782 Mixed hyperlipidemia: Secondary | ICD-10-CM | POA: Diagnosis not present

## 2018-09-16 DIAGNOSIS — E1169 Type 2 diabetes mellitus with other specified complication: Secondary | ICD-10-CM | POA: Diagnosis not present

## 2018-09-16 DIAGNOSIS — N182 Chronic kidney disease, stage 2 (mild): Secondary | ICD-10-CM | POA: Diagnosis not present

## 2018-09-23 DIAGNOSIS — E1122 Type 2 diabetes mellitus with diabetic chronic kidney disease: Secondary | ICD-10-CM | POA: Diagnosis not present

## 2018-09-23 DIAGNOSIS — E782 Mixed hyperlipidemia: Secondary | ICD-10-CM | POA: Diagnosis not present

## 2018-09-23 DIAGNOSIS — N182 Chronic kidney disease, stage 2 (mild): Secondary | ICD-10-CM | POA: Diagnosis not present

## 2018-09-23 DIAGNOSIS — E1169 Type 2 diabetes mellitus with other specified complication: Secondary | ICD-10-CM | POA: Diagnosis not present

## 2018-09-23 DIAGNOSIS — I129 Hypertensive chronic kidney disease with stage 1 through stage 4 chronic kidney disease, or unspecified chronic kidney disease: Secondary | ICD-10-CM | POA: Diagnosis not present

## 2018-10-23 DIAGNOSIS — Z136 Encounter for screening for cardiovascular disorders: Secondary | ICD-10-CM | POA: Diagnosis not present

## 2018-10-23 DIAGNOSIS — I10 Essential (primary) hypertension: Secondary | ICD-10-CM | POA: Diagnosis not present

## 2018-10-23 DIAGNOSIS — I4821 Permanent atrial fibrillation: Secondary | ICD-10-CM | POA: Diagnosis not present

## 2018-10-23 DIAGNOSIS — E119 Type 2 diabetes mellitus without complications: Secondary | ICD-10-CM | POA: Diagnosis not present

## 2018-10-23 DIAGNOSIS — E1169 Type 2 diabetes mellitus with other specified complication: Secondary | ICD-10-CM | POA: Diagnosis not present

## 2018-12-12 DIAGNOSIS — N182 Chronic kidney disease, stage 2 (mild): Secondary | ICD-10-CM | POA: Diagnosis not present

## 2018-12-12 DIAGNOSIS — E1122 Type 2 diabetes mellitus with diabetic chronic kidney disease: Secondary | ICD-10-CM | POA: Diagnosis not present

## 2018-12-12 DIAGNOSIS — Z0000 Encounter for general adult medical examination without abnormal findings: Secondary | ICD-10-CM | POA: Diagnosis not present

## 2018-12-12 DIAGNOSIS — E782 Mixed hyperlipidemia: Secondary | ICD-10-CM | POA: Diagnosis not present

## 2018-12-12 DIAGNOSIS — E1169 Type 2 diabetes mellitus with other specified complication: Secondary | ICD-10-CM | POA: Diagnosis not present

## 2018-12-12 DIAGNOSIS — I4821 Permanent atrial fibrillation: Secondary | ICD-10-CM | POA: Diagnosis not present

## 2018-12-23 DIAGNOSIS — N182 Chronic kidney disease, stage 2 (mild): Secondary | ICD-10-CM | POA: Diagnosis not present

## 2018-12-23 DIAGNOSIS — E1122 Type 2 diabetes mellitus with diabetic chronic kidney disease: Secondary | ICD-10-CM | POA: Diagnosis not present

## 2018-12-23 DIAGNOSIS — E1169 Type 2 diabetes mellitus with other specified complication: Secondary | ICD-10-CM | POA: Diagnosis not present

## 2018-12-23 DIAGNOSIS — Z0000 Encounter for general adult medical examination without abnormal findings: Secondary | ICD-10-CM | POA: Diagnosis not present

## 2018-12-23 DIAGNOSIS — I4821 Permanent atrial fibrillation: Secondary | ICD-10-CM | POA: Diagnosis not present

## 2018-12-24 DIAGNOSIS — H25013 Cortical age-related cataract, bilateral: Secondary | ICD-10-CM | POA: Diagnosis not present

## 2018-12-24 DIAGNOSIS — H5203 Hypermetropia, bilateral: Secondary | ICD-10-CM | POA: Diagnosis not present

## 2018-12-24 DIAGNOSIS — H2513 Age-related nuclear cataract, bilateral: Secondary | ICD-10-CM | POA: Diagnosis not present

## 2018-12-24 DIAGNOSIS — H524 Presbyopia: Secondary | ICD-10-CM | POA: Diagnosis not present

## 2018-12-24 DIAGNOSIS — E119 Type 2 diabetes mellitus without complications: Secondary | ICD-10-CM | POA: Diagnosis not present

## 2019-01-09 DIAGNOSIS — E782 Mixed hyperlipidemia: Secondary | ICD-10-CM | POA: Diagnosis not present

## 2019-01-09 DIAGNOSIS — I10 Essential (primary) hypertension: Secondary | ICD-10-CM | POA: Diagnosis not present

## 2019-01-09 DIAGNOSIS — I480 Paroxysmal atrial fibrillation: Secondary | ICD-10-CM | POA: Diagnosis not present

## 2019-04-28 DIAGNOSIS — N182 Chronic kidney disease, stage 2 (mild): Secondary | ICD-10-CM | POA: Diagnosis not present

## 2019-04-28 DIAGNOSIS — E1122 Type 2 diabetes mellitus with diabetic chronic kidney disease: Secondary | ICD-10-CM | POA: Diagnosis not present

## 2019-04-28 DIAGNOSIS — E782 Mixed hyperlipidemia: Secondary | ICD-10-CM | POA: Diagnosis not present

## 2019-04-28 DIAGNOSIS — I4821 Permanent atrial fibrillation: Secondary | ICD-10-CM | POA: Diagnosis not present

## 2019-04-28 DIAGNOSIS — E1169 Type 2 diabetes mellitus with other specified complication: Secondary | ICD-10-CM | POA: Diagnosis not present

## 2019-05-05 DIAGNOSIS — I10 Essential (primary) hypertension: Secondary | ICD-10-CM | POA: Diagnosis not present

## 2019-05-05 DIAGNOSIS — N182 Chronic kidney disease, stage 2 (mild): Secondary | ICD-10-CM | POA: Diagnosis not present

## 2019-05-05 DIAGNOSIS — E1169 Type 2 diabetes mellitus with other specified complication: Secondary | ICD-10-CM | POA: Diagnosis not present

## 2019-05-05 DIAGNOSIS — E782 Mixed hyperlipidemia: Secondary | ICD-10-CM | POA: Diagnosis not present

## 2019-07-10 DIAGNOSIS — I700 Atherosclerosis of aorta: Secondary | ICD-10-CM | POA: Insufficient documentation

## 2019-07-10 DIAGNOSIS — I10 Essential (primary) hypertension: Secondary | ICD-10-CM | POA: Diagnosis not present

## 2019-07-10 DIAGNOSIS — E782 Mixed hyperlipidemia: Secondary | ICD-10-CM | POA: Diagnosis not present

## 2019-07-10 DIAGNOSIS — I480 Paroxysmal atrial fibrillation: Secondary | ICD-10-CM | POA: Diagnosis not present

## 2019-07-12 DIAGNOSIS — J0100 Acute maxillary sinusitis, unspecified: Secondary | ICD-10-CM | POA: Diagnosis not present

## 2019-07-12 DIAGNOSIS — Z20822 Contact with and (suspected) exposure to covid-19: Secondary | ICD-10-CM | POA: Diagnosis not present

## 2019-07-22 DIAGNOSIS — H6123 Impacted cerumen, bilateral: Secondary | ICD-10-CM | POA: Diagnosis not present

## 2019-07-22 DIAGNOSIS — J0100 Acute maxillary sinusitis, unspecified: Secondary | ICD-10-CM | POA: Diagnosis not present

## 2019-07-22 DIAGNOSIS — H6692 Otitis media, unspecified, left ear: Secondary | ICD-10-CM | POA: Diagnosis not present

## 2019-07-26 DIAGNOSIS — J019 Acute sinusitis, unspecified: Secondary | ICD-10-CM | POA: Diagnosis not present

## 2019-07-26 DIAGNOSIS — H6692 Otitis media, unspecified, left ear: Secondary | ICD-10-CM | POA: Diagnosis not present

## 2019-08-11 DIAGNOSIS — E782 Mixed hyperlipidemia: Secondary | ICD-10-CM | POA: Diagnosis not present

## 2019-08-11 DIAGNOSIS — N182 Chronic kidney disease, stage 2 (mild): Secondary | ICD-10-CM | POA: Diagnosis not present

## 2019-08-11 DIAGNOSIS — E1169 Type 2 diabetes mellitus with other specified complication: Secondary | ICD-10-CM | POA: Diagnosis not present

## 2019-08-11 DIAGNOSIS — I10 Essential (primary) hypertension: Secondary | ICD-10-CM | POA: Diagnosis not present

## 2019-09-17 ENCOUNTER — Ambulatory Visit
Admission: RE | Admit: 2019-09-17 | Discharge: 2019-09-17 | Disposition: A | Discharge status: Home / Self Care | Payer: Medicare Other | Source: Ambulatory Visit | Attending: Family Medicine | Admitting: Family Medicine

## 2019-09-17 ENCOUNTER — Other Ambulatory Visit: Payer: Self-pay | Admitting: Family Medicine

## 2019-09-17 ENCOUNTER — Other Ambulatory Visit: Payer: Self-pay

## 2019-09-17 DIAGNOSIS — R1084 Generalized abdominal pain: Secondary | ICD-10-CM

## 2019-09-17 DIAGNOSIS — N5089 Other specified disorders of the male genital organs: Secondary | ICD-10-CM | POA: Diagnosis present

## 2019-09-17 DIAGNOSIS — B029 Zoster without complications: Secondary | ICD-10-CM | POA: Diagnosis not present

## 2019-10-01 DIAGNOSIS — Z8719 Personal history of other diseases of the digestive system: Secondary | ICD-10-CM | POA: Diagnosis not present

## 2019-10-01 DIAGNOSIS — Z9889 Other specified postprocedural states: Secondary | ICD-10-CM | POA: Diagnosis not present

## 2019-10-01 DIAGNOSIS — R1084 Generalized abdominal pain: Secondary | ICD-10-CM | POA: Diagnosis not present

## 2019-10-10 ENCOUNTER — Other Ambulatory Visit: Payer: Self-pay

## 2019-10-10 ENCOUNTER — Encounter: Payer: Self-pay | Admitting: Urology

## 2019-10-10 ENCOUNTER — Ambulatory Visit: Payer: Medicare Other | Admitting: Urology

## 2019-10-10 VITALS — BP 131/73 | HR 83 | Ht 68.0 in | Wt 180.0 lb

## 2019-10-10 DIAGNOSIS — N433 Hydrocele, unspecified: Secondary | ICD-10-CM

## 2019-10-10 NOTE — Patient Instructions (Signed)
Hydrocele, Adult A hydrocele is a collection of fluid in the loose pouch of skin that holds the testicles (scrotum). This may happen because:  The amount of fluid produced in the scrotum is not absorbed by the rest of the body.  Fluid from the abdomen fills the scrotum. Normally, the testicles develop in the abdomen then move (drop) into to the scrotum before birth. The tube that the testicles travel through usually closes after the testicles drop. If the tube does not close, fluid from the abdomen can fill the scrotum. This is less common in adults. What are the causes? The cause of a hydrocele in adults is usually not known. However, it may be caused by:  An injury to the scrotum.  An infection (epididymitis).  Decreased blood flow to the scrotum.  Twisting of a testicle (testicular torsion).  A birth defect.  A tumor or cancer of the testicle. What are the signs or symptoms? A hydrocele feels like a water-filled balloon. It may also feel heavy. Other symptoms include:  Swelling of the scrotum. The swelling may decrease when you lie down. You may also notice more swelling at night than in the morning.  Swelling of the groin.  Mild discomfort in the scrotum.  Pain. This can develop if the hydrocele was caused by infection or twisting. The larger the hydrocele, the more likely you are to have pain. How is this diagnosed? This condition may be diagnosed based on:  Physical exam.  Medical history. You may also have other tests, including:  Imaging tests, such as ultrasound.  Blood or urine tests. How is this treated? Most hydroceles go away on their own. If you have no discomfort or pain, your health care provider may suggest close monitoring of your condition (called watch and wait or watchful waiting) until the condition goes away or symptoms develop. If treatment is needed, it may include:  Treating an underlying condition. This may include using an antibiotic medicine to  treat an infection.  Surgery to stop fluid from collecting in the scrotum.  Surgery to drain the fluid. Options include: ? Needle aspiration. A needle is used to drain fluid. However, the fluid buildup will come back quickly. ? Hydrocelectomy. For this procedure, an incision is made in the scrotum to remove the fluid sac. Follow these instructions at home:  Watch the hydrocele for any changes.  Take over-the-counter and prescription medicines only as told by your health care provider.  If you were prescribed an antibiotic medicine, use it as told by your health care provider. Do not stop taking the antibiotic even if you start to feel better.  Keep all follow-up visits as told by your health care provider. This is important. Contact a health care provider if:  You notice any changes in the hydrocele.  The swelling in your scrotum or groin gets worse.  The hydrocele becomes red, firm, painful, or tender to the touch.  You have a fever. Get help right away if you:  Develop a lot of pain, or your pain becomes worse. Summary  A hydrocele is a collection of fluid in the loose pouch of skin that holds the testicles (scrotum).  Hydroceles can cause swelling, discomfort, and sometimes pain.  In adults, the cause of a hydrocele usually is not known. However, it is sometimes caused by an infection or a rotation and twisting of the scrotum.  Treatment is usually not needed. Hydroceles often go away on their own. If a hydrocele causes pain, treatment   may be given to ease the pain. This information is not intended to replace advice given to you by your health care provider. Make sure you discuss any questions you have with your health care provider. Document Revised: 01/20/2017 Document Reviewed: 01/20/2017 Elsevier Patient Education  2020 Elsevier Inc.  

## 2019-10-10 NOTE — Progress Notes (Signed)
10/10/2019 12:55 PM   Harold Pennington [birth year] [id number]  Referring provider: Josefina Do., MD [address] [place],  [place] [address]  No chief complaint on file.   HPI:  Harold Pennington was referred over by Harold Pennington for left greater than right hydrocele.  He is noted scrotal swelling "for awhile". He got shingles on his left leg 2-3 weeks ago and it was painful for the hydrocele to rub on his thigh. 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 He walks for 4-6 hours. Typically the hydroceles don't bother him too much.   He underwent scrotal ultrasound 09/17/2019 which revealed Large left (9 cm) hydrocele and smaller right hydrocele. Subcentimeter left testicular cyst and multiple right epididymal cysts or spermatoceles (small, not measured).   He has no lower urinary tract symptoms. UA clear.    PMH: Past Medical History:  Diagnosis Date  . Abdominal pain 07/2017   doubles over in pain but not sure of etiology  . Afib (HCC)   . Arthritis   . Diabetes mellitus without complication (HCC)   . Hypertension     Surgical History: Past Surgical History:  Procedure Laterality Date  . CARDIOVERSION N/A 09/20/2016   Procedure: CARDIOVERSION;  Surgeon: Lamar Blinks, MD;  Location: [facility] ORS;  Service: Cardiovascular;  Laterality: N/A;  . INGUINAL HERNIA REPAIR Right 08/20/2017   Procedure: HERNIA REPAIR INGUINAL ADULT;  Surgeon: Carolan Shiver, MD;  Location: [facility] ORS;  Service: General;  Laterality: Right;  . IRRIGATION AND DEBRIDEMENT HEMATOMA Right 08/23/2017   Procedure: IRRIGATION AND DEBRIDEMENT HEMATOMA (right inguinal hernia sight);  Surgeon: Carolan Shiver, MD;  Location: [facility] ORS;  Service: General;  Laterality: Right;    Home Medications:  Allergies as of 10/10/2019      Reactions   Xarelto [rivaroxaban] Dermatitis   Blisters on hands    Tramadol    Makes him feel dizzy and lightheaded   Codeine Nausea And Vomiting   lethargy       Medication List        Accurate as of October 10, 2019 12:55 PM. If you have any questions, ask your nurse or doctor.        acetaminophen 650 MG CR tablet Commonly known as: TYLENOL Take 650 mg by mouth 2 (two) times daily as needed for pain.   amiodarone 200 MG tablet Commonly known as: PACERONE Take 1 tablet (200 mg total) by mouth daily.   CENTRUM SILVER PO Take 1 tablet by mouth daily.   dicyclomine 10 MG capsule Commonly known as: BENTYL Take 10 mg by mouth daily.   Eliquis 5 MG Tabs tablet Generic drug: apixaban Take 5 mg by mouth 2 (two) times daily.   gabapentin 300 MG capsule Commonly known as: NEURONTIN Take 1 capsule (300 mg total) by mouth 2 (two) times daily for 7 days.   losartan 50 MG tablet Commonly known as: COZAAR Take 50 mg by mouth 2 (two) times daily.   metFORMIN 500 MG tablet Commonly known as: GLUCOPHAGE Take 1,000 mg by mouth 2 (two) times daily.   metoprolol succinate 50 MG 24 hr tablet Commonly known as: TOPROL-XL Take 50 mg by mouth daily. Take with or immediately following a meal.   Phillips Colon Health Caps Take 1 capsule by mouth daily.   rosuvastatin 10 MG tablet Commonly known as: CRESTOR Take 10 mg by mouth daily.   trolamine salicylate 10 % cream Commonly known as: ASPERCREME Apply 1 application topically as  needed for muscle pain.       Allergies:  Allergies  Allergen Reactions  . Xarelto [Rivaroxaban] Dermatitis    Blisters on hands   . Tramadol     Makes him feel dizzy and lightheaded  . Codeine Nausea And Vomiting    lethargy     Family History: No family history on file.  Social History:  reports that he quit smoking about 41 years ago. His smoking use included cigarettes. He smoked 0.50 packs per day. He has never used smokeless tobacco. He reports that he does not drink alcohol and does not use drugs.   Physical Exam: There were no vitals taken for this visit.  Constitutional:  Alert and oriented, No acute  distress. HEENT: [place] AT, moist mucus membranes.  Trachea midline, no masses. Cardiovascular: No clubbing, cyanosis, or edema. Respiratory: Normal respiratory effort, no increased work of breathing. GI: Abdomen is soft, nontender, nondistended, no abdominal masses GU: No CVA tenderness; Penis circumcised, normal foreskin, testicles descended bilaterally and palpably normal, a 3-5 cm right hydrocele, 8-10 cm left hydrocele. Bilateral epididymis not palpable. Scrotum normal DRE: Prostate 30 g, smooth without hard area or nodule Lymph: No cervical or inguinal lymphadenopathy. Skin: No rashes, bruises or suspicious lesions.Shngles lesion on left high have healed over.  Neurologic: Grossly intact, no focal deficits, moving all 4 extremities. Psychiatric: Normal mood and affect.  Laboratory Data: Lab Results  Component Value Date   WBC 7.6 08/24/2017   HGB 12.4 (L) 08/24/2017   HCT 34.9 (L) 08/24/2017   MCV 92.3 08/24/2017   PLT 225 08/24/2017    Lab Results  Component Value Date   CREATININE 1.15 09/20/2016    No results found for: PSA  No results found for: TESTOSTERONE  Lab Results  Component Value Date   HGBA1C 6.5 10/14/2013    Urinalysis No results found for: COLORURINE, APPEARANCEUR, LABSPEC, PHURINE, GLUCOSEU, HGBUR, BILIRUBINUR, KETONESUR, PROTEINUR, UROBILINOGEN, NITRITE, LEUKOCYTESUR  No results found for: LABMICR, WBCUA, RBCUA, LABEPIT, MUCUS, BACTERIA  Pertinent Imaging: Scrotal US  No results found for this or any previous visit.  No results found for this or any previous visit.  No results found for this or any previous visit.  No results found for this or any previous visit.  No results found for this or any previous visit.  No results found for this or any previous visit.  No results found for this or any previous visit.  No results found for this or any previous visit.   Assessment & Plan:    1. Left > right hydrocele - discussed the nature  r/b/a to hydrocelectomy. He does not want to miss work for the recovery. He will call or return to clinic prn if these symptoms worsen or fail to improve as anticipated.   - Urinalysis, Complete  2. PCa screening - nl DRE. Disc PSA screening and he elected not to send PSA.   No follow-ups on file.  Jerilee Field, MD  [facility] [address] [place], [place] [address] [phone]

## 2019-10-13 LAB — URINALYSIS, COMPLETE
Bilirubin, UA: NEGATIVE
Glucose, UA: NEGATIVE
Leukocytes,UA: NEGATIVE
Nitrite, UA: NEGATIVE
Protein,UA: NEGATIVE
RBC, UA: NEGATIVE
Specific Gravity, UA: >1.03 — ABNORMAL HIGH (ref 1.005–1.030)
Urobilinogen, Ur: 0.2 mg/dL (ref 0.2–1.0)
pH, UA: 5 (ref 5.0–7.5)

## 2019-10-13 LAB — MICROSCOPIC EXAMINATION
Bacteria, UA: NONE SEEN
Epithelial Cells (non renal): NONE SEEN /hpf (ref 0–10)
RBC: NONE SEEN /hpf (ref 0–2)

## 2019-10-31 DIAGNOSIS — I10 Essential (primary) hypertension: Secondary | ICD-10-CM | POA: Diagnosis not present

## 2019-11-04 DIAGNOSIS — I480 Paroxysmal atrial fibrillation: Secondary | ICD-10-CM | POA: Diagnosis not present

## 2019-11-04 DIAGNOSIS — I4819 Other persistent atrial fibrillation: Secondary | ICD-10-CM | POA: Diagnosis not present

## 2019-11-04 DIAGNOSIS — I700 Atherosclerosis of aorta: Secondary | ICD-10-CM | POA: Diagnosis not present

## 2019-11-04 DIAGNOSIS — Z23 Encounter for immunization: Secondary | ICD-10-CM | POA: Diagnosis not present

## 2019-11-04 DIAGNOSIS — I10 Essential (primary) hypertension: Secondary | ICD-10-CM | POA: Diagnosis not present

## 2019-12-29 DIAGNOSIS — R143 Flatulence: Secondary | ICD-10-CM | POA: Diagnosis not present

## 2019-12-29 DIAGNOSIS — R1084 Generalized abdominal pain: Secondary | ICD-10-CM | POA: Diagnosis not present

## 2020-01-26 DIAGNOSIS — E119 Type 2 diabetes mellitus without complications: Secondary | ICD-10-CM | POA: Diagnosis not present

## 2020-01-26 DIAGNOSIS — I10 Essential (primary) hypertension: Secondary | ICD-10-CM | POA: Diagnosis not present

## 2020-02-02 DIAGNOSIS — E782 Mixed hyperlipidemia: Secondary | ICD-10-CM | POA: Diagnosis not present

## 2020-02-02 DIAGNOSIS — E1122 Type 2 diabetes mellitus with diabetic chronic kidney disease: Secondary | ICD-10-CM | POA: Diagnosis not present

## 2020-02-02 DIAGNOSIS — I700 Atherosclerosis of aorta: Secondary | ICD-10-CM | POA: Diagnosis not present

## 2020-02-02 DIAGNOSIS — N182 Chronic kidney disease, stage 2 (mild): Secondary | ICD-10-CM | POA: Diagnosis not present

## 2020-02-02 DIAGNOSIS — Z23 Encounter for immunization: Secondary | ICD-10-CM | POA: Diagnosis not present

## 2020-02-02 DIAGNOSIS — D692 Other nonthrombocytopenic purpura: Secondary | ICD-10-CM | POA: Diagnosis not present

## 2020-02-02 DIAGNOSIS — I129 Hypertensive chronic kidney disease with stage 1 through stage 4 chronic kidney disease, or unspecified chronic kidney disease: Secondary | ICD-10-CM | POA: Diagnosis not present

## 2020-02-02 DIAGNOSIS — I10 Essential (primary) hypertension: Secondary | ICD-10-CM | POA: Diagnosis not present

## 2020-02-02 DIAGNOSIS — I4821 Permanent atrial fibrillation: Secondary | ICD-10-CM | POA: Diagnosis not present

## 2020-02-02 DIAGNOSIS — Z0000 Encounter for general adult medical examination without abnormal findings: Secondary | ICD-10-CM | POA: Diagnosis not present

## 2020-02-02 DIAGNOSIS — M150 Primary generalized (osteo)arthritis: Secondary | ICD-10-CM | POA: Diagnosis not present

## 2020-05-03 DIAGNOSIS — E782 Mixed hyperlipidemia: Secondary | ICD-10-CM | POA: Diagnosis not present

## 2020-05-03 DIAGNOSIS — I700 Atherosclerosis of aorta: Secondary | ICD-10-CM | POA: Diagnosis not present

## 2020-05-03 DIAGNOSIS — I10 Essential (primary) hypertension: Secondary | ICD-10-CM | POA: Diagnosis not present

## 2020-05-03 DIAGNOSIS — I4819 Other persistent atrial fibrillation: Secondary | ICD-10-CM | POA: Diagnosis not present

## 2020-05-17 DIAGNOSIS — E1122 Type 2 diabetes mellitus with diabetic chronic kidney disease: Secondary | ICD-10-CM | POA: Diagnosis not present

## 2020-05-17 DIAGNOSIS — D692 Other nonthrombocytopenic purpura: Secondary | ICD-10-CM | POA: Diagnosis not present

## 2020-05-17 DIAGNOSIS — I4819 Other persistent atrial fibrillation: Secondary | ICD-10-CM | POA: Diagnosis not present

## 2020-05-17 DIAGNOSIS — E782 Mixed hyperlipidemia: Secondary | ICD-10-CM | POA: Diagnosis not present

## 2020-05-17 DIAGNOSIS — I4821 Permanent atrial fibrillation: Secondary | ICD-10-CM | POA: Diagnosis not present

## 2020-05-17 DIAGNOSIS — N182 Chronic kidney disease, stage 2 (mild): Secondary | ICD-10-CM | POA: Diagnosis not present

## 2020-05-17 DIAGNOSIS — I129 Hypertensive chronic kidney disease with stage 1 through stage 4 chronic kidney disease, or unspecified chronic kidney disease: Secondary | ICD-10-CM | POA: Diagnosis not present

## 2020-05-24 DIAGNOSIS — E1122 Type 2 diabetes mellitus with diabetic chronic kidney disease: Secondary | ICD-10-CM | POA: Diagnosis not present

## 2020-05-24 DIAGNOSIS — I4821 Permanent atrial fibrillation: Secondary | ICD-10-CM | POA: Diagnosis not present

## 2020-05-24 DIAGNOSIS — N182 Chronic kidney disease, stage 2 (mild): Secondary | ICD-10-CM | POA: Diagnosis not present

## 2020-06-28 DIAGNOSIS — W5501XA Bitten by cat, initial encounter: Secondary | ICD-10-CM | POA: Diagnosis not present

## 2020-06-28 DIAGNOSIS — Z23 Encounter for immunization: Secondary | ICD-10-CM | POA: Diagnosis not present

## 2020-06-28 DIAGNOSIS — S61230A Puncture wound without foreign body of right index finger without damage to nail, initial encounter: Secondary | ICD-10-CM | POA: Diagnosis not present

## 2020-07-05 DIAGNOSIS — Z7189 Other specified counseling: Secondary | ICD-10-CM | POA: Diagnosis not present

## 2020-07-05 DIAGNOSIS — U071 COVID-19: Secondary | ICD-10-CM | POA: Diagnosis not present

## 2020-07-05 DIAGNOSIS — Z03818 Encounter for observation for suspected exposure to other biological agents ruled out: Secondary | ICD-10-CM | POA: Diagnosis not present

## 2020-07-10 DIAGNOSIS — R059 Cough, unspecified: Secondary | ICD-10-CM | POA: Diagnosis not present

## 2020-07-10 DIAGNOSIS — U071 COVID-19: Secondary | ICD-10-CM | POA: Diagnosis not present

## 2020-07-10 DIAGNOSIS — R053 Chronic cough: Secondary | ICD-10-CM | POA: Diagnosis not present

## 2020-07-28 DIAGNOSIS — Z131 Encounter for screening for diabetes mellitus: Secondary | ICD-10-CM

## 2020-07-28 LAB — GLUCOSE, POCT (MANUAL RESULT ENTRY): POC Glucose: 161 mg/dl — AB (ref 70–99)

## 2020-07-28 NOTE — Congregational Nurse Program (Signed)
  Dept: [phone]   Congregational Nurse Program Note  Date of Encounter: 07/28/2020  Past Medical History: Past Medical History:  Diagnosis Date   Abdominal pain 07/2017   doubles over in pain but not sure of etiology   Afib (HCC)    Arthritis    Diabetes mellitus without complication (HCC)    Hypertension     Encounter Details:  CNP Questionnaire - 07/28/20 2355       Questionnaire   Do you give verbal consent to treat you today? Yes    Visit Setting Church or Organization    Location Patient Served At [employer] [employer]    Patient Status Not Applicable    Medical Provider Yes    Insurance Medicare;Private Insurance    Intervention Assess (including screenings);Educate    Referrals PCP - other provider           Client into nurse only clinic for BP check per doctor's request to follow BP for a few visits after a recent BP finding of diastolic 111. BP today 124/98 and P 76 and irregular. States his BP has been off some post covid. Has appt to follow up with Dr. Waldron Labs, cardiology at [facility] later this month pending plans for ECHO and stress test. Recently wore remote monitor for 3 days but hasn't received any word on the reading/recommendations on the outcome. Denies any symptoms of chest/heart-like concerns. Client reports that he is on Metformin for diabetes but doesn't check at home.  Agrees to nonfasting screening today...161. states that is about what he is running. Reinforced consistent meds, increased fluids, and follow up with MD re: labs. 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  RTC in 1 wk for recheck/screenings. Rhermann, Rn

## 2020-09-11 ENCOUNTER — Emergency Department
Admission: EM | Admit: 2020-09-11 | Discharge: 2020-09-11 | Disposition: A | Discharge status: Home / Self Care | Payer: Medicare Other | Attending: Emergency Medicine | Admitting: Emergency Medicine

## 2020-09-11 ENCOUNTER — Other Ambulatory Visit: Payer: Self-pay

## 2020-09-11 DIAGNOSIS — Z7901 Long term (current) use of anticoagulants: Secondary | ICD-10-CM | POA: Insufficient documentation

## 2020-09-11 DIAGNOSIS — I10 Essential (primary) hypertension: Secondary | ICD-10-CM | POA: Diagnosis not present

## 2020-09-11 DIAGNOSIS — M79642 Pain in left hand: Secondary | ICD-10-CM | POA: Diagnosis not present

## 2020-09-11 DIAGNOSIS — Z79899 Other long term (current) drug therapy: Secondary | ICD-10-CM | POA: Insufficient documentation

## 2020-09-11 DIAGNOSIS — Y92512 Supermarket, store or market as the place of occurrence of the external cause: Secondary | ICD-10-CM | POA: Insufficient documentation

## 2020-09-11 DIAGNOSIS — W01198A Fall on same level from slipping, tripping and stumbling with subsequent striking against other object, initial encounter: Secondary | ICD-10-CM | POA: Insufficient documentation

## 2020-09-11 DIAGNOSIS — S01531A Puncture wound without foreign body of lip, initial encounter: Secondary | ICD-10-CM

## 2020-09-11 DIAGNOSIS — Z87891 Personal history of nicotine dependence: Secondary | ICD-10-CM | POA: Insufficient documentation

## 2020-09-11 DIAGNOSIS — M25521 Pain in right elbow: Secondary | ICD-10-CM | POA: Insufficient documentation

## 2020-09-11 DIAGNOSIS — Y9301 Activity, walking, marching and hiking: Secondary | ICD-10-CM | POA: Insufficient documentation

## 2020-09-11 DIAGNOSIS — S01511A Laceration without foreign body of lip, initial encounter: Secondary | ICD-10-CM | POA: Diagnosis not present

## 2020-09-11 DIAGNOSIS — E119 Type 2 diabetes mellitus without complications: Secondary | ICD-10-CM | POA: Diagnosis not present

## 2020-09-11 DIAGNOSIS — W010XXA Fall on same level from slipping, tripping and stumbling without subsequent striking against object, initial encounter: Secondary | ICD-10-CM

## 2020-09-11 DIAGNOSIS — Z7984 Long term (current) use of oral hypoglycemic drugs: Secondary | ICD-10-CM | POA: Insufficient documentation

## 2020-09-11 MED ORDER — BACITRACIN-NEOMYCIN-POLYMYXIN 400-5-5000 EX OINT
TOPICAL_OINTMENT | Freq: Once | CUTANEOUS | Status: AC
  Filled 2020-09-11: qty 1

## 2020-09-11 MED ORDER — TETANUS-DIPHTH-ACELL PERTUSSIS 5-2.5-18.5 LF-MCG/0.5 IM SUSY
0.5000 mL | PREFILLED_SYRINGE | Freq: Once | INTRAMUSCULAR | Status: DC
  Filled 2020-09-11: qty 0.5

## 2020-09-11 NOTE — ED Provider Notes (Signed)
[facility] Emergency Department Provider Note ____________________________________________  Time seen: 2020  I have reviewed the triage vital signs and the nursing notes.  HISTORY  Chief Complaint  Fall   HPI Harold Pennington is a 80 y.o. male presents to the ER today with laceration of his lip, right elbow pain and left hand pain status post a fall.  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  He denies any pain in the teeth or jaw.  He did not lose consciousness.  He denies headache or dizziness.  He reports mild pain in his right elbow which she describes as sore and achy.  The pain does not radiate.  He denies numbness, tingling or weakness of his right upper extremity.  He reports mild pain to his left hand.  He describes the pain as sore and achy.  The pain does not radiate.  He denies numbness, tingling or weakness of his left upper extremity.  He did not take any medications PTA.  He is on Eliquis.  He has no idea when his last tetanus was.  Past Medical History:  Diagnosis Date   Abdominal pain 07/2017   doubles over in pain but not sure of etiology   Afib (HCC)    Arthritis    Diabetes mellitus without complication (HCC)    Hypertension     Patient Active Problem List   Diagnosis Date Noted   Atherosclerosis of abdominal aorta (HCC) 07/10/2019   Postoperative seroma of subcutaneous tissue after non-dermatologic procedure 08/31/2017   Bleeding from wound 08/23/2017   Right inguinal hernia 08/20/2017   Paroxysmal A-fib (HCC) 10/30/2016   Benign essential HTN 08/17/2016   Hyperlipidemia, mixed 08/17/2016    Past Surgical History:  Procedure Laterality Date   CARDIOVERSION N/A 09/20/2016   Procedure: CARDIOVERSION;  Surgeon: Lamar Blinks, MD;  Location: [facility] ORS;  Service: Cardiovascular;  Laterality: N/A;   INGUINAL HERNIA REPAIR Right 08/20/2017    Procedure: HERNIA REPAIR INGUINAL ADULT;  Surgeon: Carolan Shiver, MD;  Location: [facility] ORS;  Service: General;  Laterality: Right;   IRRIGATION AND DEBRIDEMENT HEMATOMA Right 08/23/2017   Procedure: IRRIGATION AND DEBRIDEMENT HEMATOMA (right inguinal hernia sight);  Surgeon: Carolan Shiver, MD;  Location: [facility] ORS;  Service: General;  Laterality: Right;    Prior to Admission medications   Medication Sig Start Date End Date Taking? Authorizing Provider  acetaminophen (TYLENOL) 650 MG CR tablet Take 650 mg by mouth 2 (two) times daily as needed for pain.    [provider]  apixaban (ELIQUIS) 5 MG TABS tablet Take 5 mg by mouth 2 (two) times daily.    [provider]  dicyclomine (BENTYL) 10 MG capsule Take 10 mg by mouth daily.     [provider]  gabapentin (NEURONTIN) 300 MG capsule Take 1 capsule (300 mg total) by mouth 2 (two) times daily for 7 days. 08/24/17 08/31/17  Carolan Shiver, MD  losartan (COZAAR) 100 MG tablet Take 100 mg by mouth daily. 07/15/19   [provider]  metFORMIN (GLUCOPHAGE-XR) 500 MG 24 hr tablet SMARTSIG:2 Tablet(s) By Mouth Every Evening 07/15/19   [provider]  metoprolol succinate (TOPROL-XL) 50 MG 24 hr tablet Take 50 mg by mouth daily. Take with or immediately following a meal.     [provider]  Multiple Vitamins-Minerals (CENTRUM SILVER PO) Take 1 tablet by mouth daily.  [provider]  Probiotic Product (PHILLIPS COLON HEALTH) CAPS Take 1 capsule by mouth daily.    [provider]  rosuvastatin (CRESTOR) 10 MG tablet Take 10 mg by mouth daily. 07/09/17 07/09/18  [provider]  trolamine salicylate (ASPERCREME) 10 % cream Apply 1 application topically as needed for muscle pain.    [provider]  valACYclovir (VALTREX) 1000 MG tablet Take 1,000 mg by mouth 3 (three) times daily. 09/17/19   [provider]    Allergies Xarelto [rivaroxaban],  Tramadol, and Codeine  History reviewed. No pertinent family history.  Social History Social History   Tobacco Use   Smoking status: Former    Packs/day: 0.50    Types: Cigarettes    Quit date: 1980    Years since quitting: 42.6   Smokeless tobacco: Never  Vaping Use   Vaping Use: Never used  Substance Use Topics   Alcohol use: No   Drug use: No    Review of Systems  Constitutional: Negative for fever, chills or body aches. Eyes: Negative for visual changes. ENT: Negative for dental pain. Cardiovascular: Negative for chest pain or chest tightness. Respiratory: Negative for shortness of breath. Musculoskeletal: Positive for right elbow and left hand pain.  Negative for jaw or neck pain Skin: Positive for laceration of lower lip, abrasions of chin and nose..  Negative for bruising. Neurological: Negative for headaches,, dizziness, focal weakness, tingling or numbness. ____________________________________________  PHYSICAL EXAM:  VITAL SIGNS: ED Triage Vitals  Enc Vitals Group     BP 09/11/20 1712 (!) 124/91     Pulse Rate 09/11/20 1712 90     Resp 09/11/20 1712 16     Temp 09/11/20 1712 98.7 F (37.1 C)     Temp Source 09/11/20 1712 Oral     SpO2 09/11/20 1712 98 %     Weight 09/11/20 1713 190 lb (86.2 kg)     Height --      Head Circumference --      Peak Flow --      Pain Score 09/11/20 1713 4     Pain Loc --      Pain Edu? --      Excl. in GC? --     Constitutional: Alert and oriented. Well appearing and in no distress. Head: Normocephalic  Eyes:  Normal extraocular movements Mouth/Throat: He has a puncture wound to his left lower lip, nonbleeding at this time.  Puncture does not appear to go through the vermilion border. Cardiovascular: Normal rate, irregular rhythm.  Radial pulses 2+ bilaterally. Respiratory: Normal respiratory effort. No wheezes/rales/rhonchi. Musculoskeletal: Normal flexion, extension, rotation and lateral bending of the cervical  spine.  No pain or palpable deformity noted of the mandible.  Normal flexion, extension and rotation of the right elbow.  Normal flexion, extension and rotation of the left wrist.  No joint swelling noted.  Strength 5/5 BUE.  Handgrips equal. Neurologic:  Normal speech and language. No gross focal neurologic deficits are appreciated. Skin: Abrasion noted of chin and nose. ____________________________________________  INITIAL IMPRESSION / ASSESSMENT AND PLAN / ED COURSE  Puncture Wound of Lip, Right Elbow Pain, Left Hand Pain status post Fall:  Puncture wound of lip already showing evidence of wound healing, currently not bleeding Tdap today He declines CT maxillofacial and I agree given lack of pain or deformity No indication for CT head or cervical spine at this time He declines x-ray of his right elbow and left hand, reporting that his pain  is very minimal, no evidence of swelling, bruising, abrasion or deformity His facial wounds were cleansed with normal saline, covered with triple antibiotic ointment He will follow-up with his PCP on Monday if needed ____________________________________________  FINAL CLINICAL IMPRESSION(S) / ED DIAGNOSES  Final diagnoses:  Puncture wound of lip, initial encounter  Right elbow pain  Left hand pain  Fall due to stumbling, initial encounter      Lorre Munroe, NP 09/11/20 2053    Phineas Semen, MD 09/11/20 2106

## 2020-09-11 NOTE — ED Triage Notes (Signed)
Pt states he had a mechanical trip and fall onto concrete and busted his lip. Laceration noted on lower lip, pt c/o of left hand and right elbow pain, abrasion noted on right elbow. Pt denies LOC, denies other pain/injury, denies dizziness, denies head injury. Pt states he takes eliquis. Last tetanus was July 1st.

## 2020-09-11 NOTE — Discharge Instructions (Addendum)
You were seen today for a puncture wound of your lip, right elbow pain and left hand pain.  Your puncture wound did not need to be sutured as it already showed evidence of healing.  There was no indication for x-ray of your right elbow or left hand at this time.  You may apply ice and take Tylenol OTC as needed for pain.  You received a tetanus injection today.  Please follow-up with your PCP if your pain worsens

## 2020-09-13 DIAGNOSIS — I4819 Other persistent atrial fibrillation: Secondary | ICD-10-CM | POA: Diagnosis not present

## 2020-09-13 DIAGNOSIS — I10 Essential (primary) hypertension: Secondary | ICD-10-CM | POA: Diagnosis not present

## 2020-09-13 DIAGNOSIS — I700 Atherosclerosis of aorta: Secondary | ICD-10-CM | POA: Diagnosis not present

## 2020-09-13 DIAGNOSIS — E782 Mixed hyperlipidemia: Secondary | ICD-10-CM | POA: Diagnosis not present

## 2020-10-18 DIAGNOSIS — E1122 Type 2 diabetes mellitus with diabetic chronic kidney disease: Secondary | ICD-10-CM | POA: Diagnosis not present

## 2020-10-18 DIAGNOSIS — N182 Chronic kidney disease, stage 2 (mild): Secondary | ICD-10-CM | POA: Diagnosis not present

## 2020-10-18 DIAGNOSIS — I4821 Permanent atrial fibrillation: Secondary | ICD-10-CM | POA: Diagnosis not present

## 2020-10-25 DIAGNOSIS — E782 Mixed hyperlipidemia: Secondary | ICD-10-CM | POA: Diagnosis not present

## 2020-10-25 DIAGNOSIS — Z23 Encounter for immunization: Secondary | ICD-10-CM | POA: Diagnosis not present

## 2020-10-25 DIAGNOSIS — D692 Other nonthrombocytopenic purpura: Secondary | ICD-10-CM | POA: Diagnosis not present

## 2020-10-25 DIAGNOSIS — D6869 Other thrombophilia: Secondary | ICD-10-CM | POA: Diagnosis not present

## 2020-10-25 DIAGNOSIS — N182 Chronic kidney disease, stage 2 (mild): Secondary | ICD-10-CM | POA: Diagnosis not present

## 2020-10-25 DIAGNOSIS — E1122 Type 2 diabetes mellitus with diabetic chronic kidney disease: Secondary | ICD-10-CM | POA: Diagnosis not present

## 2020-10-25 DIAGNOSIS — I4821 Permanent atrial fibrillation: Secondary | ICD-10-CM | POA: Diagnosis not present

## 2020-12-08 NOTE — Congregational Nurse Program (Signed)
  Dept: [phone]   Congregational Nurse Program Note  Date of Encounter: 12/08/2020  Past Medical History: Past Medical History:  Diagnosis Date   Abdominal pain 07/2017   doubles over in pain but not sure of etiology   Afib (HCC)    Arthritis    Diabetes mellitus without complication (HCC)    Hypertension     Encounter Details:  CNP Questionnaire - 12/08/20 1653       Questionnaire   Do you give verbal consent to treat you today? Yes    Location Patient Served  Not Applicable    Visit Setting Church or Organization    Patient Status Unknown    Insurance Medicare    Insurance Referral N/A    Medication N/A    Medical Provider Yes    Screening Referrals N/A    Medical Referral Non-[facility] PCP/Clinic    Medical Appointment Made N/A    Food N/A    Transportation N/A    Housing/Utilities N/A    Interpersonal Safety N/A    Intervention Blood pressure;Educate    ED Visit Averted N/A    Life-Saving Intervention Made N/A            client into nurse only clinic requesting BP screening. States he continues to work at [employer] ~20 hours / week. Unsure if he took his meds this morning so will double check as soon as he gets home. BP 182/110 and rechecked in 10 min 168/110. Referred to primary care provider. RTC for nurse screening as desires.co. re decreasing salt intake. Rhermann, RN

## 2021-06-24 DIAGNOSIS — R051 Acute cough: Secondary | ICD-10-CM | POA: Diagnosis not present

## 2021-06-24 DIAGNOSIS — J069 Acute upper respiratory infection, unspecified: Secondary | ICD-10-CM | POA: Diagnosis not present

## 2021-07-06 DIAGNOSIS — E119 Type 2 diabetes mellitus without complications: Secondary | ICD-10-CM | POA: Diagnosis not present

## 2021-07-06 DIAGNOSIS — H5203 Hypermetropia, bilateral: Secondary | ICD-10-CM | POA: Diagnosis not present

## 2021-07-06 DIAGNOSIS — H52223 Regular astigmatism, bilateral: Secondary | ICD-10-CM | POA: Diagnosis not present

## 2021-07-06 DIAGNOSIS — H524 Presbyopia: Secondary | ICD-10-CM | POA: Diagnosis not present

## 2021-07-06 DIAGNOSIS — H2513 Age-related nuclear cataract, bilateral: Secondary | ICD-10-CM | POA: Diagnosis not present

## 2021-07-06 DIAGNOSIS — H25013 Cortical age-related cataract, bilateral: Secondary | ICD-10-CM | POA: Diagnosis not present

## 2021-08-01 DIAGNOSIS — M79674 Pain in right toe(s): Secondary | ICD-10-CM | POA: Diagnosis not present

## 2021-08-01 DIAGNOSIS — B351 Tinea unguium: Secondary | ICD-10-CM | POA: Diagnosis not present

## 2021-08-01 DIAGNOSIS — M79675 Pain in left toe(s): Secondary | ICD-10-CM | POA: Diagnosis not present

## 2021-10-19 DIAGNOSIS — I4821 Permanent atrial fibrillation: Secondary | ICD-10-CM | POA: Diagnosis not present

## 2021-10-19 DIAGNOSIS — I700 Atherosclerosis of aorta: Secondary | ICD-10-CM | POA: Diagnosis not present

## 2021-10-19 DIAGNOSIS — D692 Other nonthrombocytopenic purpura: Secondary | ICD-10-CM | POA: Diagnosis not present

## 2021-10-19 DIAGNOSIS — E1169 Type 2 diabetes mellitus with other specified complication: Secondary | ICD-10-CM | POA: Diagnosis not present

## 2021-10-19 DIAGNOSIS — E782 Mixed hyperlipidemia: Secondary | ICD-10-CM | POA: Diagnosis not present

## 2021-10-19 DIAGNOSIS — I129 Hypertensive chronic kidney disease with stage 1 through stage 4 chronic kidney disease, or unspecified chronic kidney disease: Secondary | ICD-10-CM | POA: Diagnosis not present

## 2021-10-19 DIAGNOSIS — D6869 Other thrombophilia: Secondary | ICD-10-CM | POA: Diagnosis not present

## 2021-10-19 DIAGNOSIS — N182 Chronic kidney disease, stage 2 (mild): Secondary | ICD-10-CM | POA: Diagnosis not present

## 2021-10-25 DIAGNOSIS — Z1152 Encounter for screening for COVID-19: Secondary | ICD-10-CM | POA: Diagnosis not present

## 2021-10-25 DIAGNOSIS — H6123 Impacted cerumen, bilateral: Secondary | ICD-10-CM | POA: Diagnosis not present

## 2021-10-25 DIAGNOSIS — U071 COVID-19: Secondary | ICD-10-CM | POA: Diagnosis not present

## 2021-10-25 DIAGNOSIS — Z03818 Encounter for observation for suspected exposure to other biological agents ruled out: Secondary | ICD-10-CM | POA: Diagnosis not present

## 2021-11-02 DIAGNOSIS — M79674 Pain in right toe(s): Secondary | ICD-10-CM | POA: Diagnosis not present

## 2021-11-02 DIAGNOSIS — M79675 Pain in left toe(s): Secondary | ICD-10-CM | POA: Diagnosis not present

## 2021-11-02 DIAGNOSIS — B351 Tinea unguium: Secondary | ICD-10-CM | POA: Diagnosis not present

## 2021-11-23 DIAGNOSIS — Z131 Encounter for screening for diabetes mellitus: Secondary | ICD-10-CM

## 2021-11-23 LAB — GLUCOSE, POCT (MANUAL RESULT ENTRY): POC Glucose: 143 mg/dl — AB (ref 70–99)

## 2021-11-23 NOTE — Congregational Nurse Program (Signed)
  Dept: [phone]   Congregational Nurse Program Note  Date of Encounter: 11/23/2021  Past Medical History: Past Medical History:  Diagnosis Date   Abdominal pain 07/2017   doubles over in pain but not sure of etiology   Afib ([place])    Arthritis    Diabetes mellitus without complication ([place])    Hypertension     Encounter Details:  CNP Questionnaire - 11/23/21 1610       Questionnaire   Ask client: Do you give verbal consent for me to treat you today? 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    Location Patient [place], [employer]    Visit Setting with Client Organization    Patient Status Unknown   lives in an apartment by himself   Insurance Medicare    Insurance/Financial Assistance Referral N/A    Medication N/A    Medical Provider Yes    Screening Referrals Made N/A    Medical Referrals Made Non-[facility] PCP/Clinic    Medical Appointment Made N/A    Recently w/o PCP, now 1st time PCP visit completed due to CNs referral or appointment made N/A    Food N/A    Transportation N/A    Housing/Utilities N/A    Interpersonal Safety N/A    Interventions Educate;Counsel    Abnormal to Normal Screening Since Last CN Visit N/A    Screenings CN Performed Blood Pressure;Blood Glucose    Sent Client to Lab for: N/A    Did client attend any of the following based off CNs referral or appointments made? N/A    ED Visit Averted N/A    Life-Saving Intervention Made N/A           Client into nurse only clinic requesting BP and glucose screening as he has no screening equipment at home. Recently had covid. States all symptoms resolved. Due to see cardiologist in Jan; due to see PCP soon. BP 152/98 glucose (non fasting) 143. States forgot to take all meds this morning but he usually takes. States will take as soon as he gets home from this visit and will follow up with BP recheck at EMS tomorrow. Encouraged to follow up w/ PCP as well. States he doesn't cook so he eats  easy, prepared foods. States for breakfast he had 2 muffins and a piece of pie. Counseled re appropriate balanced diet to Increase fruits, vegs and protein sources. States he lives alone and daughter gets after him regularly to take care of himself. 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 States he has vision insurance and needs to get new frames for a new prescription. Reinforced importance of following through. 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 Enjoys staying busy. RTC prn. Rhermann, RN

## 2021-12-01 IMAGING — US US SCROTUM W/ DOPPLER COMPLETE
1 series · 14 of 25 positions shown · non-contrast
Comparison: None.

CLINICAL DATA: Scrotal swelling.  Abdominal pain.

EXAM:
SCROTAL ULTRASOUND
DOPPLER ULTRASOUND OF THE TESTICLES
TECHNIQUE: Complete ultrasound examination of the testicles, epididymis, and
other scrotal structures was performed. Color and spectral Doppler
ultrasound were also utilized to evaluate blood flow to the
testicles.

[Series 1: us scrotum w/ doppler complete · 0.09mm/px · 14 of 54 slices shown]
[im 1/54]
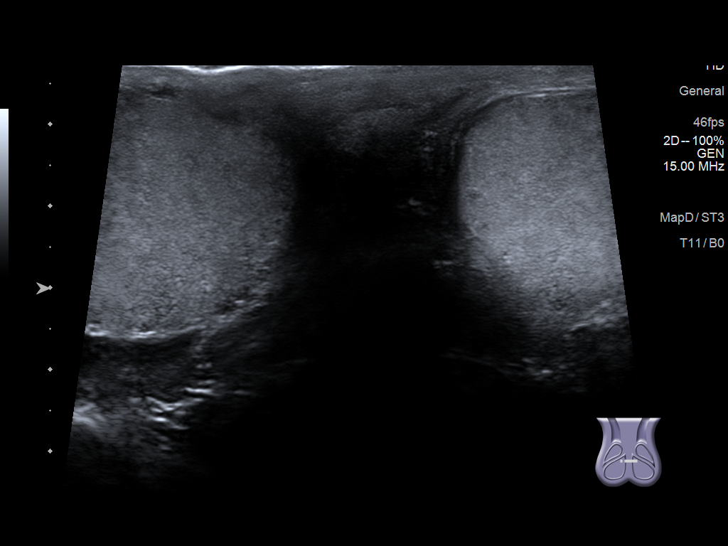
[im 5/54]
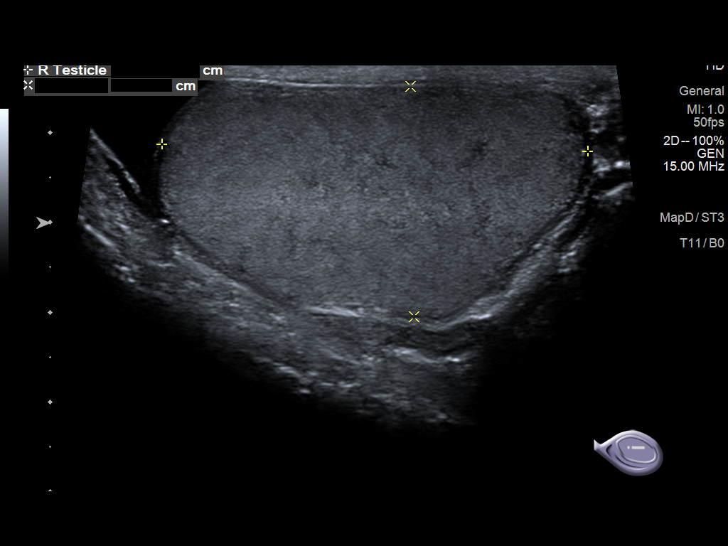
[im 9/54]
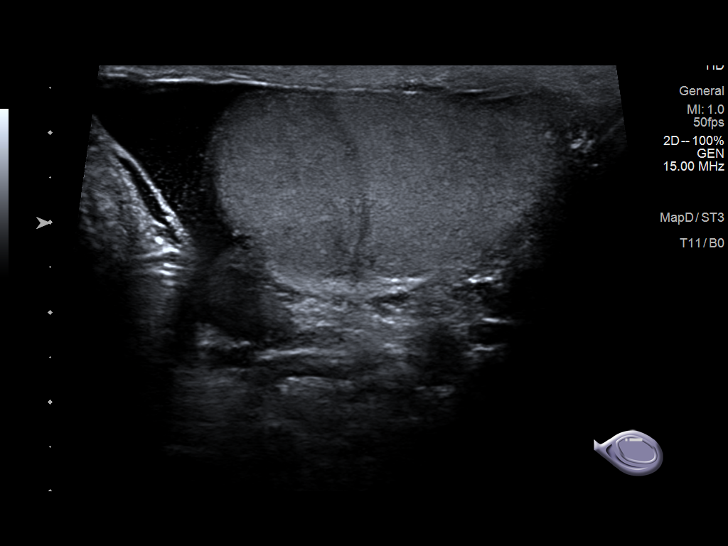
[im 14/54]
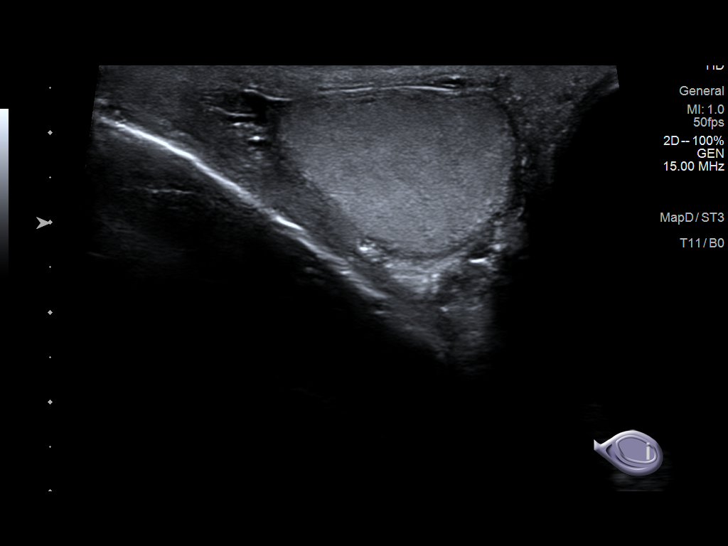
[im 18/54]
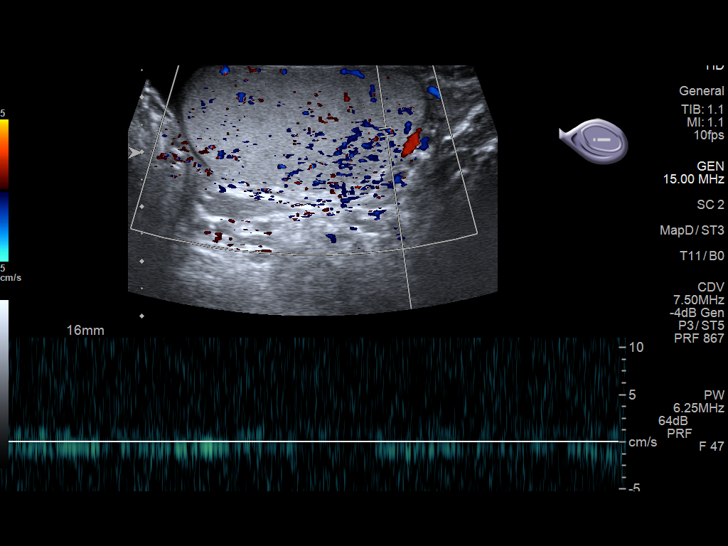
[im 20/54]
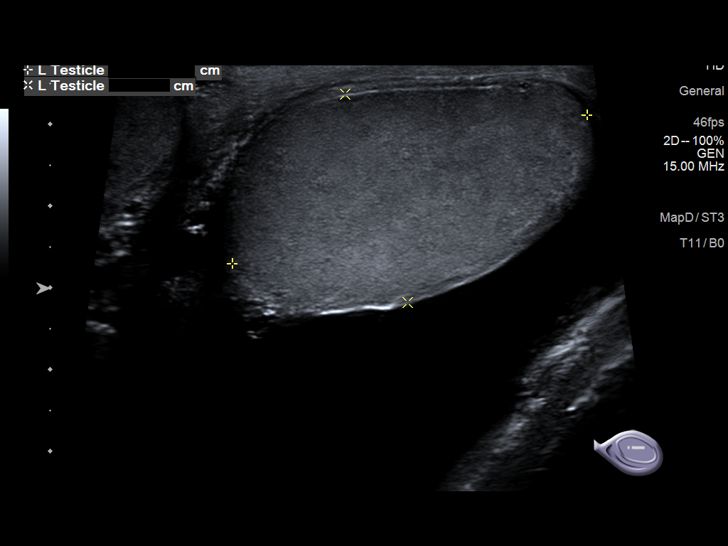
[im 25/54]
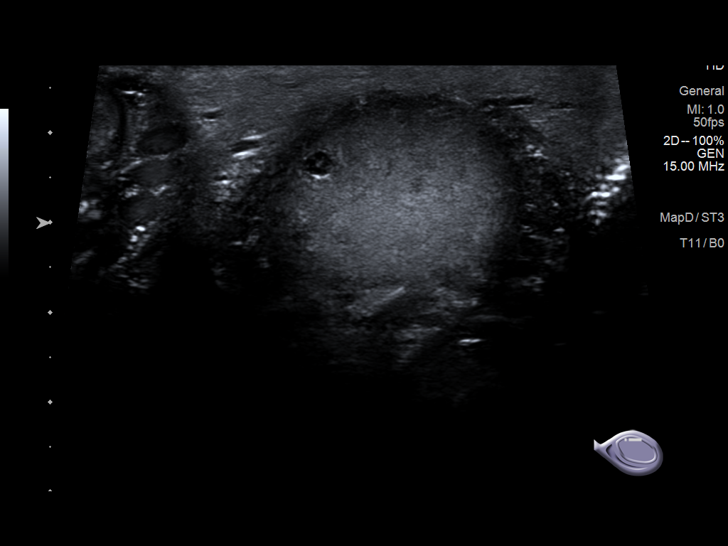
[im 29/54]
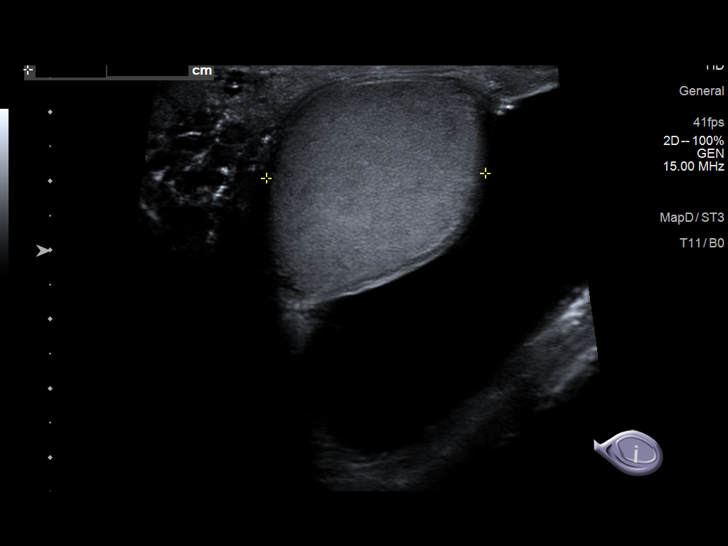
[im 34/54]
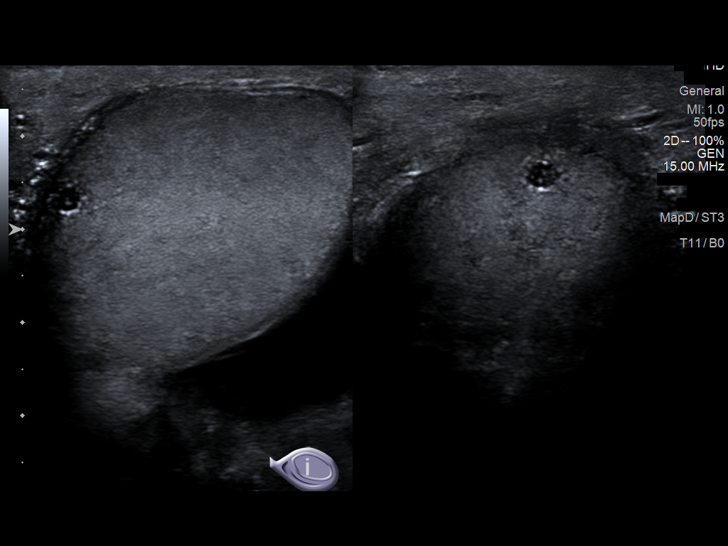
[im 36/54]
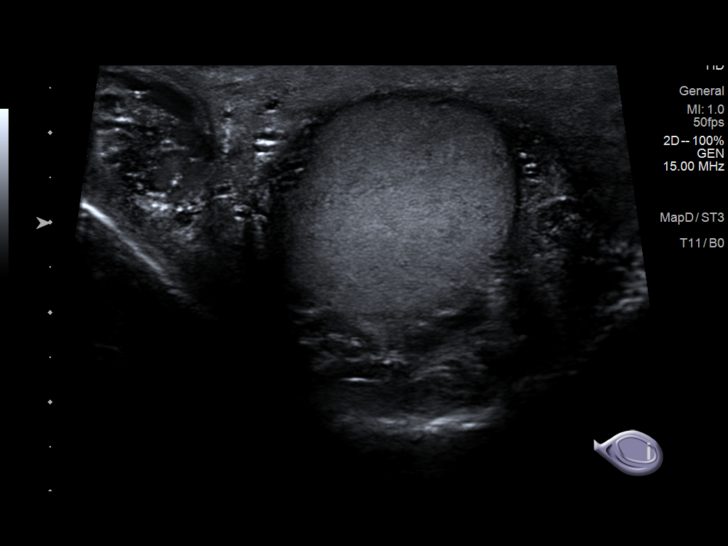
[im 40/54]
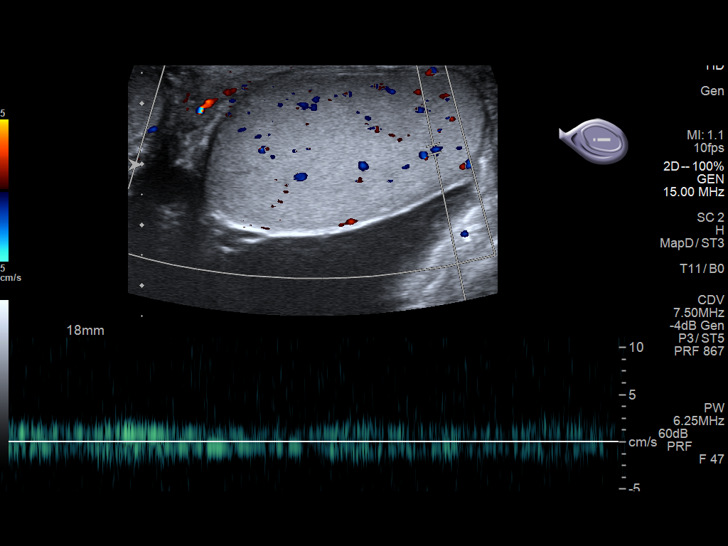
[im 45/54]
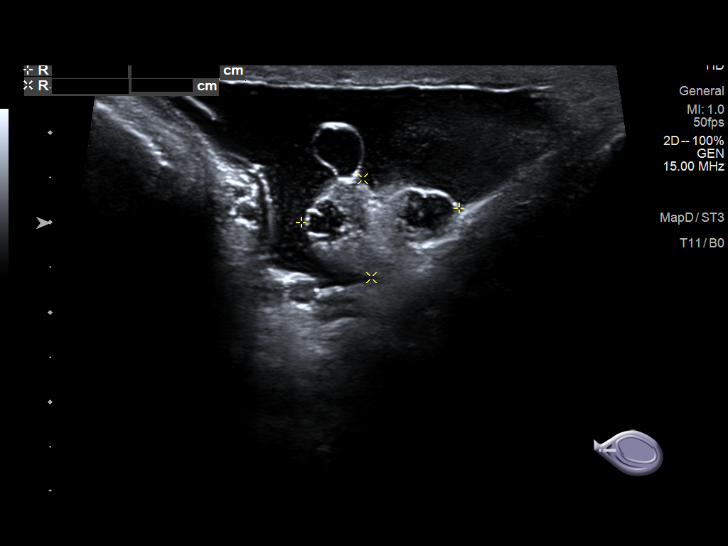
[im 49/54]
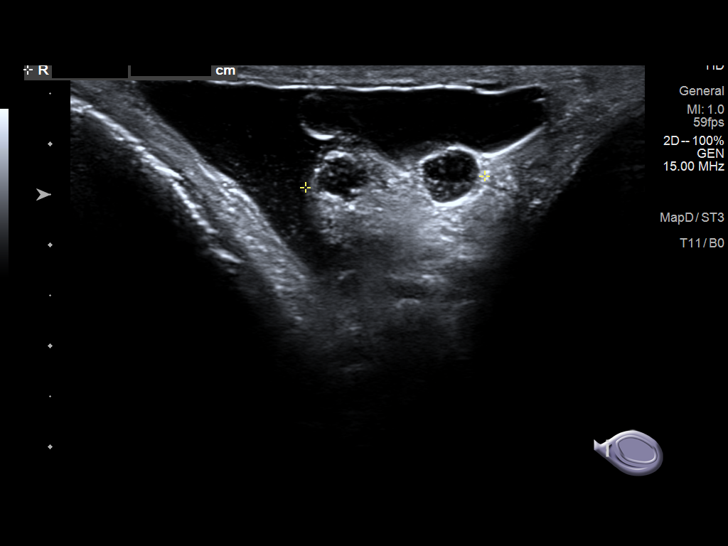
[im 54/54]
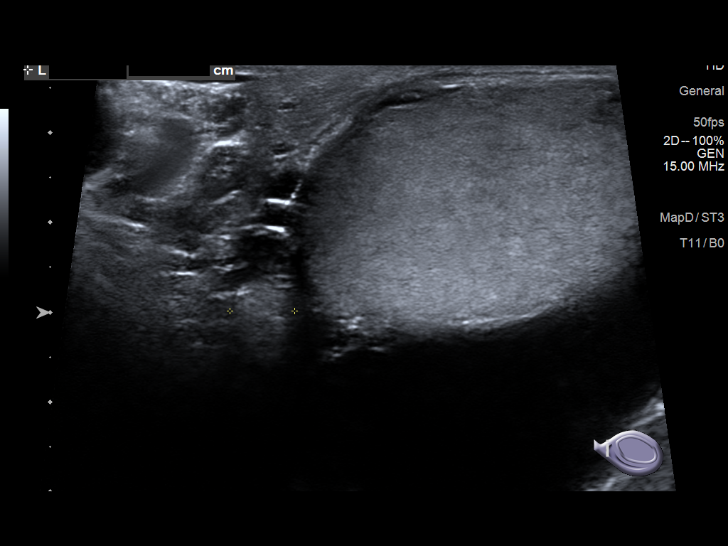

[14 of 25 positions shown; findings below may reference images not displayed]

FINDINGS: Right testicle

Measurements: 4.7 x 2.6 x 3.6 cm. No mass or microlithiasis
visualized.

Left testicle

Measurements: 4.7 x 2.7 x 3.2 cm. 4 mm nonvascular cyst with a small
amount of internal echoes.

Right epididymis:  Multiple cysts measuring up to 6 mm.

Left epididymis:  Normal in size and appearance.

Hydrocele: Small right and large left hydroceles, both simple in
appearance.

Varicocele:  None visualized.

Pulsed Doppler interrogation of both testes demonstrates normal low
resistance arterial and venous waveforms bilaterally.
IMPRESSION: 1. Large left and small right hydroceles.
2. Subcentimeter left testicular cyst and multiple right epididymal
cysts or spermatoceles.
3. No evidence of testicular torsion.

## 2022-02-01 DIAGNOSIS — I10 Essential (primary) hypertension: Secondary | ICD-10-CM | POA: Diagnosis not present

## 2022-02-01 DIAGNOSIS — I4819 Other persistent atrial fibrillation: Secondary | ICD-10-CM | POA: Diagnosis not present

## 2022-02-01 DIAGNOSIS — I700 Atherosclerosis of aorta: Secondary | ICD-10-CM | POA: Diagnosis not present

## 2022-02-01 DIAGNOSIS — R0602 Shortness of breath: Secondary | ICD-10-CM | POA: Diagnosis not present

## 2022-02-20 DIAGNOSIS — I4819 Other persistent atrial fibrillation: Secondary | ICD-10-CM | POA: Diagnosis not present

## 2022-02-20 DIAGNOSIS — R0602 Shortness of breath: Secondary | ICD-10-CM | POA: Diagnosis not present

## 2022-03-15 DIAGNOSIS — E1122 Type 2 diabetes mellitus with diabetic chronic kidney disease: Secondary | ICD-10-CM | POA: Diagnosis not present

## 2022-03-15 DIAGNOSIS — E782 Mixed hyperlipidemia: Secondary | ICD-10-CM | POA: Diagnosis not present

## 2022-03-15 DIAGNOSIS — N182 Chronic kidney disease, stage 2 (mild): Secondary | ICD-10-CM | POA: Diagnosis not present

## 2022-03-15 DIAGNOSIS — R5383 Other fatigue: Secondary | ICD-10-CM | POA: Diagnosis not present

## 2022-03-22 DIAGNOSIS — E1169 Type 2 diabetes mellitus with other specified complication: Secondary | ICD-10-CM | POA: Diagnosis not present

## 2022-03-22 DIAGNOSIS — Z0000 Encounter for general adult medical examination without abnormal findings: Secondary | ICD-10-CM | POA: Diagnosis not present

## 2022-03-22 DIAGNOSIS — I700 Atherosclerosis of aorta: Secondary | ICD-10-CM | POA: Diagnosis not present

## 2022-03-22 DIAGNOSIS — N182 Chronic kidney disease, stage 2 (mild): Secondary | ICD-10-CM | POA: Diagnosis not present

## 2022-03-22 DIAGNOSIS — D692 Other nonthrombocytopenic purpura: Secondary | ICD-10-CM | POA: Diagnosis not present

## 2022-03-22 DIAGNOSIS — D6869 Other thrombophilia: Secondary | ICD-10-CM | POA: Diagnosis not present

## 2022-03-22 DIAGNOSIS — E782 Mixed hyperlipidemia: Secondary | ICD-10-CM | POA: Diagnosis not present

## 2022-03-22 DIAGNOSIS — I4821 Permanent atrial fibrillation: Secondary | ICD-10-CM | POA: Diagnosis not present

## 2022-03-22 DIAGNOSIS — I129 Hypertensive chronic kidney disease with stage 1 through stage 4 chronic kidney disease, or unspecified chronic kidney disease: Secondary | ICD-10-CM | POA: Diagnosis not present

## 2022-04-03 DIAGNOSIS — M79675 Pain in left toe(s): Secondary | ICD-10-CM | POA: Diagnosis not present

## 2022-04-03 DIAGNOSIS — L600 Ingrowing nail: Secondary | ICD-10-CM | POA: Diagnosis not present

## 2022-04-03 DIAGNOSIS — B351 Tinea unguium: Secondary | ICD-10-CM | POA: Diagnosis not present

## 2022-04-03 DIAGNOSIS — M25872 Other specified joint disorders, left ankle and foot: Secondary | ICD-10-CM | POA: Diagnosis not present

## 2022-04-03 DIAGNOSIS — M79674 Pain in right toe(s): Secondary | ICD-10-CM | POA: Diagnosis not present

## 2022-04-03 DIAGNOSIS — M25871 Other specified joint disorders, right ankle and foot: Secondary | ICD-10-CM | POA: Diagnosis not present

## 2022-04-03 DIAGNOSIS — E119 Type 2 diabetes mellitus without complications: Secondary | ICD-10-CM | POA: Diagnosis not present

## 2022-06-28 DIAGNOSIS — M79675 Pain in left toe(s): Secondary | ICD-10-CM | POA: Diagnosis not present

## 2022-06-28 DIAGNOSIS — B351 Tinea unguium: Secondary | ICD-10-CM | POA: Diagnosis not present

## 2022-06-28 DIAGNOSIS — M79674 Pain in right toe(s): Secondary | ICD-10-CM | POA: Diagnosis not present

## 2022-08-07 DIAGNOSIS — M79671 Pain in right foot: Secondary | ICD-10-CM | POA: Diagnosis not present

## 2022-08-07 DIAGNOSIS — M19071 Primary osteoarthritis, right ankle and foot: Secondary | ICD-10-CM | POA: Diagnosis not present

## 2022-08-07 DIAGNOSIS — E782 Mixed hyperlipidemia: Secondary | ICD-10-CM | POA: Diagnosis not present

## 2022-08-07 DIAGNOSIS — I10 Essential (primary) hypertension: Secondary | ICD-10-CM | POA: Diagnosis not present

## 2022-08-07 DIAGNOSIS — I4819 Other persistent atrial fibrillation: Secondary | ICD-10-CM | POA: Diagnosis not present

## 2022-08-07 DIAGNOSIS — N182 Chronic kidney disease, stage 2 (mild): Secondary | ICD-10-CM | POA: Diagnosis not present

## 2022-08-07 DIAGNOSIS — E1169 Type 2 diabetes mellitus with other specified complication: Secondary | ICD-10-CM | POA: Diagnosis not present

## 2022-08-07 DIAGNOSIS — L84 Corns and callosities: Secondary | ICD-10-CM | POA: Diagnosis not present

## 2022-08-07 DIAGNOSIS — I700 Atherosclerosis of aorta: Secondary | ICD-10-CM | POA: Diagnosis not present

## 2022-08-07 DIAGNOSIS — M7661 Achilles tendinitis, right leg: Secondary | ICD-10-CM | POA: Diagnosis not present

## 2022-08-21 DIAGNOSIS — M25871 Other specified joint disorders, right ankle and foot: Secondary | ICD-10-CM | POA: Diagnosis not present

## 2022-08-21 DIAGNOSIS — E119 Type 2 diabetes mellitus without complications: Secondary | ICD-10-CM | POA: Diagnosis not present

## 2022-08-21 DIAGNOSIS — L97511 Non-pressure chronic ulcer of other part of right foot limited to breakdown of skin: Secondary | ICD-10-CM | POA: Diagnosis not present

## 2022-09-06 DIAGNOSIS — L97511 Non-pressure chronic ulcer of other part of right foot limited to breakdown of skin: Secondary | ICD-10-CM | POA: Diagnosis not present

## 2022-09-06 DIAGNOSIS — B351 Tinea unguium: Secondary | ICD-10-CM | POA: Diagnosis not present

## 2022-09-06 DIAGNOSIS — L600 Ingrowing nail: Secondary | ICD-10-CM | POA: Diagnosis not present

## 2022-09-06 DIAGNOSIS — E119 Type 2 diabetes mellitus without complications: Secondary | ICD-10-CM | POA: Diagnosis not present

## 2022-09-20 DIAGNOSIS — Z03818 Encounter for observation for suspected exposure to other biological agents ruled out: Secondary | ICD-10-CM | POA: Diagnosis not present

## 2022-09-20 DIAGNOSIS — R112 Nausea with vomiting, unspecified: Secondary | ICD-10-CM | POA: Diagnosis not present

## 2022-09-20 DIAGNOSIS — B9689 Other specified bacterial agents as the cause of diseases classified elsewhere: Secondary | ICD-10-CM | POA: Diagnosis not present

## 2022-09-20 DIAGNOSIS — J209 Acute bronchitis, unspecified: Secondary | ICD-10-CM | POA: Diagnosis not present

## 2022-09-20 DIAGNOSIS — J019 Acute sinusitis, unspecified: Secondary | ICD-10-CM | POA: Diagnosis not present

## 2022-09-20 DIAGNOSIS — U071 COVID-19: Secondary | ICD-10-CM | POA: Diagnosis not present

## 2022-10-09 DIAGNOSIS — N182 Chronic kidney disease, stage 2 (mild): Secondary | ICD-10-CM | POA: Diagnosis not present

## 2022-10-09 DIAGNOSIS — E782 Mixed hyperlipidemia: Secondary | ICD-10-CM | POA: Diagnosis not present

## 2022-10-09 DIAGNOSIS — I129 Hypertensive chronic kidney disease with stage 1 through stage 4 chronic kidney disease, or unspecified chronic kidney disease: Secondary | ICD-10-CM | POA: Diagnosis not present

## 2022-10-09 DIAGNOSIS — E1169 Type 2 diabetes mellitus with other specified complication: Secondary | ICD-10-CM | POA: Diagnosis not present

## 2022-10-16 DIAGNOSIS — D6869 Other thrombophilia: Secondary | ICD-10-CM | POA: Diagnosis not present

## 2022-10-16 DIAGNOSIS — N182 Chronic kidney disease, stage 2 (mild): Secondary | ICD-10-CM | POA: Diagnosis not present

## 2022-10-16 DIAGNOSIS — I700 Atherosclerosis of aorta: Secondary | ICD-10-CM | POA: Diagnosis not present

## 2022-10-16 DIAGNOSIS — I4821 Permanent atrial fibrillation: Secondary | ICD-10-CM | POA: Diagnosis not present

## 2022-10-16 DIAGNOSIS — E1169 Type 2 diabetes mellitus with other specified complication: Secondary | ICD-10-CM | POA: Diagnosis not present

## 2022-10-16 DIAGNOSIS — E782 Mixed hyperlipidemia: Secondary | ICD-10-CM | POA: Diagnosis not present

## 2022-10-16 DIAGNOSIS — Z23 Encounter for immunization: Secondary | ICD-10-CM | POA: Diagnosis not present

## 2022-10-16 DIAGNOSIS — D692 Other nonthrombocytopenic purpura: Secondary | ICD-10-CM | POA: Diagnosis not present

## 2022-10-16 DIAGNOSIS — I129 Hypertensive chronic kidney disease with stage 1 through stage 4 chronic kidney disease, or unspecified chronic kidney disease: Secondary | ICD-10-CM | POA: Diagnosis not present

## 2022-10-18 DIAGNOSIS — E119 Type 2 diabetes mellitus without complications: Secondary | ICD-10-CM | POA: Diagnosis not present

## 2022-10-18 DIAGNOSIS — L97511 Non-pressure chronic ulcer of other part of right foot limited to breakdown of skin: Secondary | ICD-10-CM | POA: Diagnosis not present

## 2023-02-19 DIAGNOSIS — I4819 Other persistent atrial fibrillation: Secondary | ICD-10-CM | POA: Diagnosis not present

## 2023-02-19 DIAGNOSIS — E782 Mixed hyperlipidemia: Secondary | ICD-10-CM | POA: Diagnosis not present

## 2023-02-19 DIAGNOSIS — N182 Chronic kidney disease, stage 2 (mild): Secondary | ICD-10-CM | POA: Diagnosis not present

## 2023-02-19 DIAGNOSIS — I10 Essential (primary) hypertension: Secondary | ICD-10-CM | POA: Diagnosis not present

## 2023-02-19 DIAGNOSIS — E1169 Type 2 diabetes mellitus with other specified complication: Secondary | ICD-10-CM | POA: Diagnosis not present

## 2023-02-19 DIAGNOSIS — R0602 Shortness of breath: Secondary | ICD-10-CM | POA: Diagnosis not present

## 2023-03-14 DIAGNOSIS — J019 Acute sinusitis, unspecified: Secondary | ICD-10-CM | POA: Diagnosis not present

## 2023-03-19 DIAGNOSIS — J019 Acute sinusitis, unspecified: Secondary | ICD-10-CM | POA: Diagnosis not present

## 2023-03-19 DIAGNOSIS — J209 Acute bronchitis, unspecified: Secondary | ICD-10-CM | POA: Diagnosis not present

## 2023-03-19 DIAGNOSIS — B9689 Other specified bacterial agents as the cause of diseases classified elsewhere: Secondary | ICD-10-CM | POA: Diagnosis not present

## 2023-03-26 DIAGNOSIS — E782 Mixed hyperlipidemia: Secondary | ICD-10-CM | POA: Diagnosis not present

## 2023-03-26 DIAGNOSIS — D6869 Other thrombophilia: Secondary | ICD-10-CM | POA: Diagnosis not present

## 2023-03-26 DIAGNOSIS — N182 Chronic kidney disease, stage 2 (mild): Secondary | ICD-10-CM | POA: Diagnosis not present

## 2023-03-26 DIAGNOSIS — E1169 Type 2 diabetes mellitus with other specified complication: Secondary | ICD-10-CM | POA: Diagnosis not present

## 2023-03-26 DIAGNOSIS — I4821 Permanent atrial fibrillation: Secondary | ICD-10-CM | POA: Diagnosis not present

## 2023-03-26 DIAGNOSIS — R5383 Other fatigue: Secondary | ICD-10-CM | POA: Diagnosis not present

## 2023-03-26 DIAGNOSIS — D692 Other nonthrombocytopenic purpura: Secondary | ICD-10-CM | POA: Diagnosis not present

## 2023-03-26 DIAGNOSIS — I129 Hypertensive chronic kidney disease with stage 1 through stage 4 chronic kidney disease, or unspecified chronic kidney disease: Secondary | ICD-10-CM | POA: Diagnosis not present

## 2023-03-26 DIAGNOSIS — I700 Atherosclerosis of aorta: Secondary | ICD-10-CM | POA: Diagnosis not present

## 2023-04-02 DIAGNOSIS — D6869 Other thrombophilia: Secondary | ICD-10-CM | POA: Diagnosis not present

## 2023-04-02 DIAGNOSIS — D692 Other nonthrombocytopenic purpura: Secondary | ICD-10-CM | POA: Diagnosis not present

## 2023-04-02 DIAGNOSIS — E1169 Type 2 diabetes mellitus with other specified complication: Secondary | ICD-10-CM | POA: Diagnosis not present

## 2023-04-02 DIAGNOSIS — I129 Hypertensive chronic kidney disease with stage 1 through stage 4 chronic kidney disease, or unspecified chronic kidney disease: Secondary | ICD-10-CM | POA: Diagnosis not present

## 2023-04-02 DIAGNOSIS — E782 Mixed hyperlipidemia: Secondary | ICD-10-CM | POA: Diagnosis not present

## 2023-04-02 DIAGNOSIS — I4821 Permanent atrial fibrillation: Secondary | ICD-10-CM | POA: Diagnosis not present

## 2023-04-02 DIAGNOSIS — I700 Atherosclerosis of aorta: Secondary | ICD-10-CM | POA: Diagnosis not present

## 2023-04-02 DIAGNOSIS — Z0000 Encounter for general adult medical examination without abnormal findings: Secondary | ICD-10-CM | POA: Diagnosis not present

## 2023-04-02 DIAGNOSIS — N182 Chronic kidney disease, stage 2 (mild): Secondary | ICD-10-CM | POA: Diagnosis not present

## 2023-04-25 DIAGNOSIS — H25013 Cortical age-related cataract, bilateral: Secondary | ICD-10-CM | POA: Diagnosis not present

## 2023-04-25 DIAGNOSIS — H2513 Age-related nuclear cataract, bilateral: Secondary | ICD-10-CM | POA: Diagnosis not present

## 2023-04-25 DIAGNOSIS — H524 Presbyopia: Secondary | ICD-10-CM | POA: Diagnosis not present

## 2023-04-25 DIAGNOSIS — E113293 Type 2 diabetes mellitus with mild nonproliferative diabetic retinopathy without macular edema, bilateral: Secondary | ICD-10-CM | POA: Diagnosis not present

## 2023-04-25 DIAGNOSIS — H5203 Hypermetropia, bilateral: Secondary | ICD-10-CM | POA: Diagnosis not present

## 2023-04-25 DIAGNOSIS — H52223 Regular astigmatism, bilateral: Secondary | ICD-10-CM | POA: Diagnosis not present

## 2023-04-27 DIAGNOSIS — L84 Corns and callosities: Secondary | ICD-10-CM | POA: Diagnosis not present

## 2023-04-27 DIAGNOSIS — M79671 Pain in right foot: Secondary | ICD-10-CM | POA: Diagnosis not present

## 2023-07-30 DIAGNOSIS — M79675 Pain in left toe(s): Secondary | ICD-10-CM | POA: Diagnosis not present

## 2023-07-30 DIAGNOSIS — L600 Ingrowing nail: Secondary | ICD-10-CM | POA: Diagnosis not present

## 2023-07-30 DIAGNOSIS — E119 Type 2 diabetes mellitus without complications: Secondary | ICD-10-CM | POA: Diagnosis not present

## 2023-07-30 DIAGNOSIS — M79674 Pain in right toe(s): Secondary | ICD-10-CM | POA: Diagnosis not present

## 2023-07-30 DIAGNOSIS — B351 Tinea unguium: Secondary | ICD-10-CM | POA: Diagnosis not present

## 2023-07-30 DIAGNOSIS — M25871 Other specified joint disorders, right ankle and foot: Secondary | ICD-10-CM | POA: Diagnosis not present

## 2023-10-29 DIAGNOSIS — E1169 Type 2 diabetes mellitus with other specified complication: Secondary | ICD-10-CM | POA: Diagnosis not present

## 2023-10-29 DIAGNOSIS — I4821 Permanent atrial fibrillation: Secondary | ICD-10-CM | POA: Diagnosis not present

## 2023-10-29 DIAGNOSIS — I129 Hypertensive chronic kidney disease with stage 1 through stage 4 chronic kidney disease, or unspecified chronic kidney disease: Secondary | ICD-10-CM | POA: Diagnosis not present

## 2023-10-29 DIAGNOSIS — D692 Other nonthrombocytopenic purpura: Secondary | ICD-10-CM | POA: Diagnosis not present

## 2023-10-29 DIAGNOSIS — E782 Mixed hyperlipidemia: Secondary | ICD-10-CM | POA: Diagnosis not present

## 2023-10-29 DIAGNOSIS — I700 Atherosclerosis of aorta: Secondary | ICD-10-CM | POA: Diagnosis not present

## 2023-10-29 DIAGNOSIS — D6869 Other thrombophilia: Secondary | ICD-10-CM | POA: Diagnosis not present

## 2023-10-29 DIAGNOSIS — N182 Chronic kidney disease, stage 2 (mild): Secondary | ICD-10-CM | POA: Diagnosis not present

## 2023-11-05 DIAGNOSIS — D692 Other nonthrombocytopenic purpura: Secondary | ICD-10-CM | POA: Diagnosis not present

## 2023-11-05 DIAGNOSIS — D6869 Other thrombophilia: Secondary | ICD-10-CM | POA: Diagnosis not present

## 2023-11-05 DIAGNOSIS — E782 Mixed hyperlipidemia: Secondary | ICD-10-CM | POA: Diagnosis not present

## 2023-11-05 DIAGNOSIS — I129 Hypertensive chronic kidney disease with stage 1 through stage 4 chronic kidney disease, or unspecified chronic kidney disease: Secondary | ICD-10-CM | POA: Diagnosis not present

## 2023-11-05 DIAGNOSIS — I4821 Permanent atrial fibrillation: Secondary | ICD-10-CM | POA: Diagnosis not present

## 2023-11-05 DIAGNOSIS — I700 Atherosclerosis of aorta: Secondary | ICD-10-CM | POA: Diagnosis not present

## 2023-11-05 DIAGNOSIS — E1169 Type 2 diabetes mellitus with other specified complication: Secondary | ICD-10-CM | POA: Diagnosis not present

## 2023-11-05 DIAGNOSIS — N182 Chronic kidney disease, stage 2 (mild): Secondary | ICD-10-CM | POA: Diagnosis not present

## 2023-11-05 DIAGNOSIS — Z23 Encounter for immunization: Secondary | ICD-10-CM | POA: Diagnosis not present
